# Patient Record
Sex: Female | Born: 1965 | Race: Black or African American | Hispanic: No | Smoking: Current some day smoker
Health system: Southern US, Community
[De-identification: ages and names within clinical notes are randomized; demographics above are authoritative.]

## PROBLEM LIST (undated history)

## (undated) DIAGNOSIS — C801 Malignant (primary) neoplasm, unspecified: Secondary | ICD-10-CM

## (undated) DIAGNOSIS — F1991 Other psychoactive substance use, unspecified, in remission: Secondary | ICD-10-CM

## (undated) DIAGNOSIS — Z87898 Personal history of other specified conditions: Secondary | ICD-10-CM

## (undated) DIAGNOSIS — J45909 Unspecified asthma, uncomplicated: Secondary | ICD-10-CM

## (undated) HISTORY — PX: ABDOMINAL HYSTERECTOMY: SHX81

## (undated) HISTORY — PX: OTHER SURGICAL HISTORY: SHX169

---

## 2004-11-16 ENCOUNTER — Emergency Department: Payer: Self-pay | Admitting: General Practice

## 2005-02-09 ENCOUNTER — Emergency Department: Payer: Self-pay | Admitting: Emergency Medicine

## 2006-06-19 ENCOUNTER — Emergency Department: Payer: Self-pay | Admitting: Unknown Physician Specialty

## 2010-01-19 ENCOUNTER — Observation Stay (HOSPITAL_COMMUNITY): Admission: EM | Admit: 2010-01-19 | Discharge: 2010-01-20 | Payer: Self-pay | Admitting: Emergency Medicine

## 2010-11-04 ENCOUNTER — Emergency Department (HOSPITAL_COMMUNITY)
Admission: EM | Admit: 2010-11-04 | Discharge: 2010-11-04 | Disposition: A | Discharge status: Home / Self Care | Payer: No Typology Code available for payment source | Attending: Emergency Medicine | Admitting: Emergency Medicine

## 2010-11-04 ENCOUNTER — Emergency Department (HOSPITAL_COMMUNITY): Payer: No Typology Code available for payment source

## 2010-11-04 DIAGNOSIS — J45909 Unspecified asthma, uncomplicated: Secondary | ICD-10-CM | POA: Insufficient documentation

## 2010-11-04 DIAGNOSIS — M545 Low back pain, unspecified: Secondary | ICD-10-CM | POA: Insufficient documentation

## 2012-11-13 ENCOUNTER — Emergency Department (HOSPITAL_BASED_OUTPATIENT_CLINIC_OR_DEPARTMENT_OTHER)
Admission: EM | Admit: 2012-11-13 | Discharge: 2012-11-13 | Disposition: A | Discharge status: Home / Self Care | Payer: Self-pay | Attending: Emergency Medicine | Admitting: Emergency Medicine

## 2012-11-13 ENCOUNTER — Encounter (HOSPITAL_BASED_OUTPATIENT_CLINIC_OR_DEPARTMENT_OTHER): Payer: Self-pay | Admitting: Emergency Medicine

## 2012-11-13 DIAGNOSIS — Z79899 Other long term (current) drug therapy: Secondary | ICD-10-CM | POA: Insufficient documentation

## 2012-11-13 DIAGNOSIS — Y929 Unspecified place or not applicable: Secondary | ICD-10-CM | POA: Insufficient documentation

## 2012-11-13 DIAGNOSIS — T628X1A Toxic effect of other specified noxious substances eaten as food, accidental (unintentional), initial encounter: Secondary | ICD-10-CM | POA: Insufficient documentation

## 2012-11-13 DIAGNOSIS — Z859 Personal history of malignant neoplasm, unspecified: Secondary | ICD-10-CM | POA: Insufficient documentation

## 2012-11-13 DIAGNOSIS — R221 Localized swelling, mass and lump, neck: Secondary | ICD-10-CM | POA: Insufficient documentation

## 2012-11-13 DIAGNOSIS — R220 Localized swelling, mass and lump, head: Secondary | ICD-10-CM | POA: Insufficient documentation

## 2012-11-13 DIAGNOSIS — J45909 Unspecified asthma, uncomplicated: Secondary | ICD-10-CM | POA: Insufficient documentation

## 2012-11-13 DIAGNOSIS — Z880 Allergy status to penicillin: Secondary | ICD-10-CM | POA: Insufficient documentation

## 2012-11-13 DIAGNOSIS — Y939 Activity, unspecified: Secondary | ICD-10-CM | POA: Insufficient documentation

## 2012-11-13 DIAGNOSIS — Z87891 Personal history of nicotine dependence: Secondary | ICD-10-CM | POA: Insufficient documentation

## 2012-11-13 DIAGNOSIS — T7840XA Allergy, unspecified, initial encounter: Secondary | ICD-10-CM

## 2012-11-13 HISTORY — DX: Unspecified asthma, uncomplicated: J45.909

## 2012-11-13 HISTORY — DX: Malignant (primary) neoplasm, unspecified: C80.1

## 2012-11-13 MED ORDER — DIPHENHYDRAMINE HCL 25 MG PO TABS: 25.0000 mg | ORAL_TABLET | Freq: Four times a day (QID) | ORAL | Status: DC

## 2012-11-13 MED ORDER — METHYLPREDNISOLONE SODIUM SUCC 125 MG IJ SOLR
125.0000 mg | Freq: Once | INTRAMUSCULAR | Status: AC
  Administered 2012-11-13: 125 mg via INTRAVENOUS
  Filled 2012-11-13: qty 2

## 2012-11-13 MED ORDER — FAMOTIDINE 20 MG PO TABS: 20.0000 mg | ORAL_TABLET | Freq: Two times a day (BID) | ORAL | Status: DC

## 2012-11-13 MED ORDER — PREDNISONE 50 MG PO TABS: ORAL_TABLET | ORAL | Status: DC

## 2012-11-13 MED ORDER — FAMOTIDINE IN NACL 20-0.9 MG/50ML-% IV SOLN
20.0000 mg | Freq: Once | INTRAVENOUS | Status: AC
  Administered 2012-11-13: 20 mg via INTRAVENOUS
  Filled 2012-11-13: qty 50

## 2012-11-13 MED ORDER — SODIUM CHLORIDE 0.9 % IV BOLUS (SEPSIS)
1000.0000 mL | Freq: Once | INTRAVENOUS | Status: AC
  Administered 2012-11-13: 1000 mL via INTRAVENOUS

## 2012-11-13 MED ORDER — EPINEPHRINE 0.3 MG/0.3ML IJ DEVI: 0.3000 mg | INTRAMUSCULAR | Status: DC | PRN

## 2012-11-13 NOTE — ED Notes (Signed)
MD at bedside. 

## 2012-11-13 NOTE — ED Notes (Signed)
RT Note: Patient presents to the ED following an allergic reaction. Breath sounds are clear and diminished with good air movement noted. No stridor nor acute respiratory distress was noted. Patients tongue does not appear swollen at this time either. Patient is stable and RT will continue to monitor.

## 2012-11-13 NOTE — ED Notes (Signed)
Took bite of chocolate and didn't know it had peanut butter in it.  Spit it out immediately when she tasted it, but felt the reaction.  Tongue and lips swelling and numb.  No resp affect.  No urticaria.  EMS gave Benadryl 50mg  po and 0.3 EPI IM.

## 2012-11-13 NOTE — ED Provider Notes (Signed)
History  This chart was scribed for Vanessa Octave, MD by Bennett Scrape, ED Scribe. This patient was seen in room MH02/MH02 and the patient's care was started at 4:20 PM  CSN: [id number]  Arrival date & time      First MD Initiated Contact with Patient 11/13/12 1620      Chief Complaint  Patient presents with  . Allergic Reaction     The history is provided by the patient. No language interpreter was used.    Vanessa Caldwell is a 47 y.o. female who presents to the Emergency Department complaining of gradual onset, gradually improving, constant mild facial swelling to bilateral lips with associated mild tongue swelling attributed to an allergic reaction to peanuts. Pt reports that she ate a piece of chocolate with peanut butter out of a bag of chocolates not realizing that there was peanut butter in the candy. She states that she spit out the piece of candy immediately and called EMS when she started to swell. She was given 0.3 EPI by the paramedics with improvement. She denies rash, trouble swallowing, difficulty breathing, and SOB as associated symptoms. She has a h.o asthma and reports prior hospitalizations for asthma exacerbation. She denies having a h/o HTN or being on any HTN medications.   No PCP currently, states just moved to [place].  Past Medical History  Diagnosis Date  . Asthma   . Cancer     Past Surgical History  Procedure Laterality Date  . Cesarean section    . Abdominal hysterectomy      No family history on file.  History  Substance Use Topics  . Smoking status: Former Games developer  . Smokeless tobacco: Not on file  . Alcohol Use: No    No OB history provided.  Review of Systems  A complete 10 system review of systems was obtained and all systems are negative except as noted in the HPI and PMH.   Allergies  Peanuts; Bee venom; Other; and Penicillins  Home Medications   Current Outpatient Rx  Name  Route  Sig  Dispense  Refill  . albuterol  (ACCUNEB) 1.25 MG/3ML nebulizer solution   Nebulization   Take 1 ampule by nebulization every 6 (six) hours as needed for wheezing.         Marland Kitchen albuterol (PROVENTIL HFA;VENTOLIN HFA) 108 (90 BASE) MCG/ACT inhaler   Inhalation   Inhale 2 puffs into the lungs every 6 (six) hours as needed for wheezing.           Triage Vitals: BP 156/86  Pulse 81  Temp(Src) 97.9 F (36.6 C) (Oral)  Resp 18  Ht 5\' 8"  (1.727 m)  Wt 310 lb (140.615 kg)  BMI 47.15 kg/m2  SpO2 99%  Physical Exam  Nursing note and vitals reviewed. Constitutional: She is oriented to person, place, and time. She appears well-developed and well-nourished. No distress.  HENT:  Head: Normocephalic and atraumatic.  Mild swelling of upper and lower lip, no tongue swelling, no uvular edema, floor of mouth is soft, handling secretions   Eyes: Conjunctivae and EOM are normal. Pupils are equal, round, and reactive to light.  Neck: Normal range of motion. Neck supple. No tracheal deviation present.  Cardiovascular: Normal rate and regular rhythm.   No murmur heard. Pulmonary/Chest: Effort normal and breath sounds normal. No stridor. No respiratory distress. She has no wheezes.  Abdominal: Soft. There is no tenderness.  Musculoskeletal: Normal range of motion.  Neurological: She is alert and oriented to person,  place, and time.  Skin: Skin is warm and dry. No rash noted.  Psychiatric: She has a normal mood and affect. Her behavior is normal.    ED Course  Procedures (including critical care time)  Medications  sodium chloride 0.9 % bolus 1,000 mL (1,000 mLs Intravenous New Bag/Given 11/13/12 1654)  methylPREDNISolone sodium succinate (SOLU-MEDROL) 125 mg/2 mL injection 125 mg (125 mg Intravenous Given 11/13/12 1655)  famotidine (PEPCID) IVPB 20 mg (0 mg Intravenous Stopped 11/13/12 1730)    DIAGNOSTIC STUDIES: Oxygen Saturation is 99% on room air, normal by my interpretation.    COORDINATION OF CARE: 4:35 PM-Discussed  treatment plan which includes prednisone and observation with pt at bedside and pt agreed to plan.   5:51 PM-Pt rechecked and is resting comfortably. Discussed discharge plan with pt and pt agreed to plan. Also advised pt to follow up as needed and pt agreed.  Labs Reviewed - No data to display No results found.   No diagnosis found.    MDM  Patient presents with tongue and lip swelling after allergic reaction to peanuts. She received epinephrine and antihistamines by EMS. No chest pain, shortness of breath or wheezing.  Patient states she is feeling better in terms of her tongue and lip swelling. Denies any difficulty breathing or wheezing. she is observed in the ED for one hour without deterioration in symptoms. Her tongue and swelling improved. Will discharge on antihistamines, steroids, EpiPen given, return precautions discussed.     I personally performed the services described in this documentation, which was scribed in my presence. The recorded information has been reviewed and is accurate.      Vanessa Octave, MD 11/13/12 1758

## 2014-01-25 ENCOUNTER — Emergency Department: Payer: Self-pay | Admitting: Emergency Medicine

## 2014-02-05 ENCOUNTER — Emergency Department: Payer: Self-pay | Admitting: Emergency Medicine

## 2014-02-05 LAB — COMPREHENSIVE METABOLIC PANEL
ALT: 18 U/L (ref 12–78)
Albumin: 3.7 g/dL (ref 3.4–5.0)
Alkaline Phosphatase: 91 U/L
Anion Gap: 8 (ref 7–16)
BUN: 17 mg/dL (ref 7–18)
Bilirubin,Total: 0.4 mg/dL (ref 0.2–1.0)
CO2: 26 mmol/L (ref 21–32)
Calcium, Total: 8.9 mg/dL (ref 8.5–10.1)
Chloride: 103 mmol/L (ref 98–107)
Creatinine: 1.26 mg/dL (ref 0.60–1.30)
EGFR (African American): 58 — ABNORMAL LOW
GFR CALC NON AF AMER: 50 — AB
Glucose: 153 mg/dL — ABNORMAL HIGH (ref 65–99)
OSMOLALITY: 278 (ref 275–301)
POTASSIUM: 3.7 mmol/L (ref 3.5–5.1)
SGOT(AST): 15 U/L (ref 15–37)
SODIUM: 137 mmol/L (ref 136–145)
Total Protein: 8.2 g/dL (ref 6.4–8.2)

## 2014-02-05 LAB — CBC
HCT: 39.8 % (ref 35.0–47.0)
HGB: 13.3 g/dL (ref 12.0–16.0)
MCH: 31.6 pg (ref 26.0–34.0)
MCHC: 33.4 g/dL (ref 32.0–36.0)
MCV: 95 fL (ref 80–100)
Platelet: 249 10*3/uL (ref 150–440)
RBC: 4.21 10*6/uL (ref 3.80–5.20)
RDW: 12.7 % (ref 11.5–14.5)
WBC: 10.3 10*3/uL (ref 3.6–11.0)

## 2014-02-14 ENCOUNTER — Emergency Department: Payer: Self-pay | Admitting: Emergency Medicine

## 2014-02-15 LAB — BASIC METABOLIC PANEL
ANION GAP: 9 (ref 7–16)
BUN: 15 mg/dL (ref 7–18)
CO2: 28 mmol/L (ref 21–32)
CREATININE: 1.29 mg/dL (ref 0.60–1.30)
Calcium, Total: 9.3 mg/dL (ref 8.5–10.1)
Chloride: 105 mmol/L (ref 98–107)
EGFR (African American): 56 — ABNORMAL LOW
EGFR (Non-African Amer.): 49 — ABNORMAL LOW
GLUCOSE: 126 mg/dL — AB (ref 65–99)
OSMOLALITY: 285 (ref 275–301)
POTASSIUM: 4.4 mmol/L (ref 3.5–5.1)
SODIUM: 142 mmol/L (ref 136–145)

## 2014-02-15 LAB — CBC WITH DIFFERENTIAL/PLATELET
BASOS ABS: 0 10*3/uL (ref 0.0–0.1)
Basophil %: 0.5 %
EOS ABS: 0.2 10*3/uL (ref 0.0–0.7)
Eosinophil %: 2.6 %
HCT: 36.9 % (ref 35.0–47.0)
HGB: 12.4 g/dL (ref 12.0–16.0)
LYMPHS ABS: 1.5 10*3/uL (ref 1.0–3.6)
Lymphocyte %: 22.1 %
MCH: 31.6 pg (ref 26.0–34.0)
MCHC: 33.7 g/dL (ref 32.0–36.0)
MCV: 94 fL (ref 80–100)
MONOS PCT: 7.2 %
Monocyte #: 0.5 x10 3/mm (ref 0.2–0.9)
NEUTROS ABS: 4.7 10*3/uL (ref 1.4–6.5)
NEUTROS PCT: 67.6 %
Platelet: 212 10*3/uL (ref 150–440)
RBC: 3.93 10*6/uL (ref 3.80–5.20)
RDW: 12.7 % (ref 11.5–14.5)
WBC: 7 10*3/uL (ref 3.6–11.0)

## 2014-06-12 ENCOUNTER — Observation Stay: Payer: Self-pay | Admitting: Internal Medicine

## 2014-06-12 LAB — CBC WITH DIFFERENTIAL/PLATELET
BASOS PCT: 0.7 %
Basophil #: 0.1 10*3/uL (ref 0.0–0.1)
EOS ABS: 0.2 10*3/uL (ref 0.0–0.7)
EOS PCT: 2.5 %
HCT: 38.5 % (ref 35.0–47.0)
HGB: 12.6 g/dL (ref 12.0–16.0)
LYMPHS ABS: 1.5 10*3/uL (ref 1.0–3.6)
LYMPHS PCT: 20 %
MCH: 30.8 pg (ref 26.0–34.0)
MCHC: 32.7 g/dL (ref 32.0–36.0)
MCV: 94 fL (ref 80–100)
Monocyte #: 0.5 x10 3/mm (ref 0.2–0.9)
Monocyte %: 7.1 %
NEUTROS ABS: 5.1 10*3/uL (ref 1.4–6.5)
Neutrophil %: 69.7 %
PLATELETS: 226 10*3/uL (ref 150–440)
RBC: 4.09 10*6/uL (ref 3.80–5.20)
RDW: 12.9 % (ref 11.5–14.5)
WBC: 7.3 10*3/uL (ref 3.6–11.0)

## 2014-06-12 LAB — BASIC METABOLIC PANEL
Anion Gap: 7 (ref 7–16)
BUN: 13 mg/dL (ref 7–18)
CALCIUM: 8.5 mg/dL (ref 8.5–10.1)
CO2: 27 mmol/L (ref 21–32)
Chloride: 104 mmol/L (ref 98–107)
Creatinine: 0.92 mg/dL (ref 0.60–1.30)
EGFR (African American): >60
GLUCOSE: 112 mg/dL — AB (ref 65–99)
OSMOLALITY: 277 (ref 275–301)
POTASSIUM: 4 mmol/L (ref 3.5–5.1)
Sodium: 138 mmol/L (ref 136–145)

## 2014-10-08 ENCOUNTER — Ambulatory Visit: Payer: Self-pay

## 2014-11-15 NOTE — Discharge Summary (Signed)
PATIENT NAME:  Vanessa Caldwell, Vanessa Caldwell MR#:  [id number] DATE OF BIRTH:  03/22/1966  DATE OF ADMISSION:  06/12/2014 DATE OF DISCHARGE:  06/13/2014  PRESENTING COMPLAINT: Right leg swelling and bug bite.  DISCHARGE DIAGNOSES:  1.  Right tibial shin cellulitis status post bug bite, improved.  2.  Asthma.  3.  Morbid obesity.  CONDITION ON DISCHARGE: Fair.  CODE STATUS: FULL code.   DISCHARGE MEDICATIONS: 1.  Clindamycin 600 mg p.o. t.i.d. 2.  Ibuprofen 600 mg t.i.d. with meals as needed.  3.  ProAir HFA 90 mcg inhalation 2 puffs 4 times a day as needed.   DISCHARGE INSTRUCTIONS: Follow up with your primary care physician as needed. Use rolling walker.   DIAGNOSTIC DATA: Hemoglobin 12.6 and 38.5. White count 7.3. Basic metabolic panel within normal limits.   BRIEF SUMMARY OF HOSPITAL COURSE: Ms. Sweaney is a 48 year old Caucasian female with past medical history of asthma and obesity who comes in with:  1.  Cellulitis, right lower extremity, after bug bite. No fever. She was on IV vancomycin. Ibuprofen for pain. Good pedal pulses. Changed to p.o. clindamycin. Blood cultures were not sent from the ER. The patient remained afebrile.  2.  Asthma. Completely stable.  3.  Morbid obesity. The patient encouraged balanced diet and exercise.   Hospital stay otherwise remained stable. The patient remained a FULL code.   TIME SPENT: 40 minutes.  ____________________________ Hart Rochester Posey Pronto, MD sap:sb D: 06/13/2014 14:45:05 ET T: 06/13/2014 17:12:47 ET JOB#: [id number]  cc: Humzah Harty A. Posey Pronto, MD, <Dictator> Ilda Basset MD ELECTRONICALLY SIGNED 06/26/2014 14:06

## 2014-11-15 NOTE — H&P (Signed)
PATIENT NAME:  Vanessa Caldwell, Vanessa Caldwell MR#:  [id number] DATE OF BIRTH:  1966/07/16  DATE OF ADMISSION:  06/12/2014  REFERRING PHYSICIAN: Larae Grooms, MD  PRIMARY CARE PHYSICIAN: Nonlocal.   ADMISSION DIAGNOSIS: Cellulitis.   HISTORY OF PRESENT ILLNESS: This is a 49 year old African American female who presents to the Emergency Department complaining of pain in her right lower extremity. The patient states that 4 days ago she felt something nonpainful on her right lower leg that she thought might be lint. On closer inspection, the object was found to be an insect that was trying to burrow into her leg. The patient states that her fiancee was able to remove the insect with some tweezers. She covered the wound area with an oatmeal preparation hoping that it would help with the pain and redness. However, both the pain and redness increased over the next few days. She denies any fevers, but admits to some nausea. She smoked some marijuana that helped relieve her nausea. She came to the Emergency Department because her leg is so tender and swollen that she states it is hard to walk. The patient is confident that the bug was not a tick. She describes it as flat and red and that it was very difficult to kill. Due to a concern for rapidly progressive cellulitis and possible necrotizing fasciitis, the Emergency Department called for admission.   REVIEW OF SYSTEMS: CONSTITUTIONAL: The patient denies fever or weakness.  EYES: Denies blurred vision or inflammation.  EARS, NOSE AND THROAT: Denies tinnitus or difficulty swallowing.  RESPIRATORY: Denies cough or wheezing.  CARDIOVASCULAR: Denies chest pain, orthopnea, palpitations.  GASTROINTESTINAL: Admits to nausea but denies vomiting, diarrhea or abdominal pain.  GENITOURINARY: Denies dysuria or increased frequency or hesitancy.  ENDOCRINE: Denies polyuria, polydipsia. HEMATOLOGIC AND LYMPHATIC: Denies easy bruising or bleeding.  INTEGUMENT: Admits to an insect  bite and surrounding pain and erythema. MUSCULOSKELETAL: Admits to myalgias in the right lower extremity when she tries to ambulate, but denies arthralgias. NEUROLOGIC: Denies numbness in her extremities or difficulty speaking.  PSYCHIATRIC: Denies depression or suicidal ideation.   PAST MEDICAL HISTORY: Asthma. The patient has had cellulitis in the past from wounds associated with bee stings and spider bites.  PAST SURGICAL HISTORY: C-sections, partial hysterectomy, tonsillectomy and adenoidectomy.   FAMILY HISTORY: All of her immediate family has diabetes. Heart disease runs throughout the family and her father is suffering from prostate cancer.   SOCIAL HISTORY: The patient states that she rarely drinks or smokes. 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 Sometimes she smokes marijuana.  MEDICATIONS: ProAir 90 mcg/inhalation 2 puffs inhaled every 4 hours as needed for coughing, wheezing, or shortness of breath.   ALLERGIES: BEE STINGS AND PENICILLIN.  DIAGNOSTIC DATA: Pertinent laboratory results and radiographic findings are not available at this time as they are pending.   PHYSICAL EXAMINATION: VITAL SIGNS: Temperature is 97.8, pulse 80, respirations 20, blood pressure 140/85, pulse oximetry is 97% on room air.  GENERAL: The patient is alert and oriented x3, in no apparent distress.  HEENT: Normocephalic, atraumatic. Pupils equal, round, and reactive to light and accommodation. Extraocular movements are intact. Mucous membranes are moist.  NECK: Trachea is midline. No adenopathy.  CHEST: Symmetric and atraumatic.  CARDIOVASCULAR: Regular rate and rhythm. Normal S1, S2. No rubs, clicks, or murmurs appreciated.  LUNGS: Clear to auscultation bilaterally. Normal effort and excursion.  ABDOMEN: Positive bowel sounds. Soft, nontender, nondistended. No hepatosplenomegaly. GENITALIA: Deferred.  MUSCULOSKELETAL: The patient moves all 4 extremities  equally. There is 5/5 strength in  upper and lower extremities bilaterally.  SKIN: The patient has area of erythema, tenderness and warmth that is non-suppurative and  non-fluctuant that I have outlined on her right lower extremity. At its greatest dimension, it is approximately 8 inches in length. There is swelling even around the non-erythematous area and the patient states that it is painful to palpate in the non-erythematous areas as well.  NEUROLOGIC: Cranial nerves II through XII are grossly intact.  PSYCHIATRIC: Mood is normal. Affect is congruent.   ASSESSMENT AND PLAN: This is a 49 year old female admitted for cellulitis.  1.  Cellulitis, extremely painful, and one might say it is out of proportion with the physical examination. The concern is for necrotizing fasciitis. The patient has received a dose of vancomycin and Zosyn in the Emergency Department. I will order a surgery consult to evaluate the need for debridement.  2.  Asthma, completely stable. The patient denies any respiratory symptoms associated with this illness.  3.  Morbid obesity. The patient's BMI is 43.9. I have encouraged a balanced diet and exercise.  4.  Substance abuse. The patient has not had any recreational narcotics in 19 years. She admits to occasional marijuana use, however.  5.  Deep vein thrombosis prophylaxis. Heparin.  6.  Gastrointestinal prophylaxis. Unnecessary as the patient is not critically ill.   CODE STATUS: The patient is a FULL code.   TIME SPENT ON ADMISSION ORDERS AND PATIENT CARE: Approximately 35 minutes.   ____________________________ Norva Riffle. Marcille Blanco, MD msd:sb D: 06/12/2014 07:26:35 ET T: 06/12/2014 07:49:47 ET JOB#: [id number]  cc: Norva Riffle. Marcille Blanco, MD, <Dictator>  Norva Riffle Kerston Landeck MD ELECTRONICALLY SIGNED 06/14/2014 0:57

## 2014-11-18 ENCOUNTER — Emergency Department
Admit: 2014-11-18 | Disposition: A | Discharge status: Home / Self Care | Payer: Self-pay | Admitting: Emergency Medicine

## 2014-11-18 LAB — CBC WITH DIFFERENTIAL/PLATELET
BASOS ABS: 0.1 10*3/uL (ref 0.0–0.1)
BASOS PCT: 0.9 %
EOS ABS: 0.2 10*3/uL (ref 0.0–0.7)
EOS PCT: 1.7 %
HCT: 41.1 % (ref 35.0–47.0)
HGB: 14.1 g/dL (ref 12.0–16.0)
Lymphocyte #: 2 10*3/uL (ref 1.0–3.6)
Lymphocyte %: 22.5 %
MCH: 32.2 pg (ref 26.0–34.0)
MCHC: 34.2 g/dL (ref 32.0–36.0)
MCV: 94 fL (ref 80–100)
MONO ABS: 0.4 x10 3/mm (ref 0.2–0.9)
Monocyte %: 4.8 %
NEUTROS PCT: 70.1 %
Neutrophil #: 6.3 10*3/uL (ref 1.4–6.5)
Platelet: 230 10*3/uL (ref 150–440)
RBC: 4.37 10*6/uL (ref 3.80–5.20)
RDW: 12.7 % (ref 11.5–14.5)
WBC: 8.9 10*3/uL (ref 3.6–11.0)

## 2014-11-18 LAB — URINALYSIS, COMPLETE
BILIRUBIN, UR: NEGATIVE
Glucose,UR: NEGATIVE mg/dL (ref 0–75)
Ketone: NEGATIVE
Nitrite: NEGATIVE
PH: 5 (ref 4.5–8.0)
Protein: NEGATIVE
SPECIFIC GRAVITY: 1.024 (ref 1.003–1.030)

## 2014-11-18 LAB — WET PREP, GENITAL

## 2014-11-18 LAB — COMPREHENSIVE METABOLIC PANEL
ALBUMIN: 4.4 g/dL
ALT: 14 U/L
AST: 22 U/L
Alkaline Phosphatase: 88 U/L
Anion Gap: 8 (ref 7–16)
BUN: 13 mg/dL
Bilirubin,Total: 0.4 mg/dL
CALCIUM: 9.4 mg/dL
Chloride: 104 mmol/L
Co2: 26 mmol/L
Creatinine: 0.96 mg/dL
EGFR (Non-African Amer.): >60
GLUCOSE: 112 mg/dL — AB
POTASSIUM: 4.4 mmol/L
SODIUM: 138 mmol/L
TOTAL PROTEIN: 8.6 g/dL — AB

## 2014-11-19 LAB — GC/CHLAMYDIA PROBE AMP

## 2014-12-06 DIAGNOSIS — R1900 Intra-abdominal and pelvic swelling, mass and lump, unspecified site: Secondary | ICD-10-CM | POA: Insufficient documentation

## 2014-12-18 ENCOUNTER — Encounter: Payer: Self-pay | Admitting: *Deleted

## 2014-12-18 ENCOUNTER — Inpatient Hospital Stay
Admission: AD | Admit: 2014-12-18 | Discharge: 2014-12-25 | DRG: 742 | Disposition: A | Discharge status: Home / Self Care | Payer: Self-pay | Source: Ambulatory Visit | Attending: Obstetrics and Gynecology | Admitting: Obstetrics and Gynecology

## 2014-12-18 DIAGNOSIS — Z9889 Other specified postprocedural states: Secondary | ICD-10-CM

## 2014-12-18 DIAGNOSIS — R52 Pain, unspecified: Secondary | ICD-10-CM | POA: Diagnosis present

## 2014-12-18 DIAGNOSIS — F1290 Cannabis use, unspecified, uncomplicated: Secondary | ICD-10-CM | POA: Diagnosis present

## 2014-12-18 DIAGNOSIS — R310 Gross hematuria: Secondary | ICD-10-CM | POA: Diagnosis present

## 2014-12-18 DIAGNOSIS — N839 Noninflammatory disorder of ovary, fallopian tube and broad ligament, unspecified: Principal | ICD-10-CM | POA: Diagnosis present

## 2014-12-18 DIAGNOSIS — N736 Female pelvic peritoneal adhesions (postinfective): Secondary | ICD-10-CM | POA: Diagnosis present

## 2014-12-18 DIAGNOSIS — Z880 Allergy status to penicillin: Secondary | ICD-10-CM

## 2014-12-18 DIAGNOSIS — K913 Postprocedural intestinal obstruction: Secondary | ICD-10-CM | POA: Diagnosis present

## 2014-12-18 DIAGNOSIS — D62 Acute posthemorrhagic anemia: Secondary | ICD-10-CM | POA: Diagnosis not present

## 2014-12-18 DIAGNOSIS — R34 Anuria and oliguria: Secondary | ICD-10-CM | POA: Diagnosis present

## 2014-12-18 DIAGNOSIS — J45909 Unspecified asthma, uncomplicated: Secondary | ICD-10-CM | POA: Diagnosis present

## 2014-12-18 DIAGNOSIS — Z87891 Personal history of nicotine dependence: Secondary | ICD-10-CM

## 2014-12-18 DIAGNOSIS — Z6841 Body Mass Index (BMI) 40.0 and over, adult: Secondary | ICD-10-CM

## 2014-12-18 DIAGNOSIS — R1900 Intra-abdominal and pelvic swelling, mass and lump, unspecified site: Secondary | ICD-10-CM | POA: Diagnosis present

## 2014-12-18 HISTORY — DX: Personal history of other specified conditions: Z87.898

## 2014-12-18 HISTORY — DX: Other psychoactive substance use, unspecified, in remission: F19.91

## 2014-12-18 LAB — CBC WITH DIFFERENTIAL/PLATELET
BASOS PCT: 1 %
Basophils Absolute: 0 10*3/uL (ref 0–0.1)
EOS ABS: 0.2 10*3/uL (ref 0–0.7)
Eosinophils Relative: 3 %
HCT: 36.9 % (ref 35.0–47.0)
Hemoglobin: 12.5 g/dL (ref 12.0–16.0)
Lymphocytes Relative: 31 %
Lymphs Abs: 2.2 10*3/uL (ref 1.0–3.6)
MCH: 32.1 pg (ref 26.0–34.0)
MCHC: 33.8 g/dL (ref 32.0–36.0)
MCV: 94.9 fL (ref 80.0–100.0)
MONO ABS: 0.4 10*3/uL (ref 0.2–0.9)
MONOS PCT: 5 %
NEUTROS ABS: 4.3 10*3/uL (ref 1.4–6.5)
Neutrophils Relative %: 60 %
PLATELETS: 193 10*3/uL (ref 150–440)
RBC: 3.88 MIL/uL (ref 3.80–5.20)
RDW: 12.8 % (ref 11.5–14.5)
WBC: 7.1 10*3/uL (ref 3.6–11.0)

## 2014-12-18 LAB — COMPREHENSIVE METABOLIC PANEL
ALT: 12 U/L — AB (ref 14–54)
AST: 24 U/L (ref 15–41)
Albumin: 4 g/dL (ref 3.5–5.0)
Alkaline Phosphatase: 79 U/L (ref 38–126)
Anion gap: 7 (ref 5–15)
BUN: 15 mg/dL (ref 6–20)
CO2: 26 mmol/L (ref 22–32)
Calcium: 8.9 mg/dL (ref 8.9–10.3)
Chloride: 105 mmol/L (ref 101–111)
Creatinine, Ser: 0.9 mg/dL (ref 0.44–1.00)
GFR calc Af Amer: >60 mL/min (ref >60)
Glucose, Bld: 148 mg/dL — ABNORMAL HIGH (ref 65–99)
Potassium: 3.4 mmol/L — ABNORMAL LOW (ref 3.5–5.1)
Sodium: 138 mmol/L (ref 135–145)
TOTAL PROTEIN: 7.4 g/dL (ref 6.5–8.1)
Total Bilirubin: 0.4 mg/dL (ref 0.3–1.2)

## 2014-12-18 LAB — URINALYSIS COMPLETE WITH MICROSCOPIC (ARMC ONLY)
Bilirubin Urine: NEGATIVE
Glucose, UA: NEGATIVE mg/dL
Hgb urine dipstick: NEGATIVE
Ketones, ur: NEGATIVE mg/dL
NITRITE: NEGATIVE
PROTEIN: NEGATIVE mg/dL
Specific Gravity, Urine: 1.019 (ref 1.005–1.030)
pH: 5 (ref 5.0–8.0)

## 2014-12-18 LAB — LACTATE DEHYDROGENASE: LDH: 126 U/L (ref 98–192)

## 2014-12-18 LAB — PROTIME-INR
INR: 0.98
PROTHROMBIN TIME: 13.2 s (ref 11.4–15.0)

## 2014-12-18 LAB — LIPASE, BLOOD: Lipase: 35 U/L (ref 22–51)

## 2014-12-18 MED ORDER — FAMOTIDINE 20 MG PO TABS
20.0000 mg | ORAL_TABLET | Freq: Two times a day (BID) | ORAL | Status: DC
Start: 2014-12-18 — End: 2014-12-19
  Administered 2014-12-18 – 2014-12-19 (×2): 20 mg via ORAL
  Filled 2014-12-18 (×2): qty 1

## 2014-12-18 MED ORDER — DIPHENHYDRAMINE HCL 50 MG/ML IJ SOLN: 12.5000 mg | Freq: Four times a day (QID) | INTRAMUSCULAR | Status: DC | PRN

## 2014-12-18 MED ORDER — ONDANSETRON HCL 4 MG/2ML IJ SOLN
4.0000 mg | Freq: Four times a day (QID) | INTRAMUSCULAR | Status: DC | PRN
  Administered 2014-12-24: 4 mg via INTRAVENOUS
  Filled 2014-12-18 (×4): qty 2

## 2014-12-18 MED ORDER — SODIUM CHLORIDE 0.9 % IJ SOLN: 9.0000 mL | INTRAMUSCULAR | Status: DC | PRN

## 2014-12-18 MED ORDER — BUDESONIDE 0.25 MG/2ML IN SUSP
0.2500 mg | Freq: Two times a day (BID) | RESPIRATORY_TRACT | Status: DC
  Administered 2014-12-18 – 2014-12-20 (×4): 0.25 mg via RESPIRATORY_TRACT
  Filled 2014-12-18 (×7): qty 2

## 2014-12-18 MED ORDER — HYDROMORPHONE 0.3 MG/ML IV SOLN
INTRAVENOUS | Status: DC
  Administered 2014-12-19 (×2): via INTRAVENOUS
  Administered 2014-12-19: 7.1 mL via INTRAVENOUS
  Filled 2014-12-18 (×2): qty 25

## 2014-12-18 MED ORDER — HYDROMORPHONE HCL 1 MG/ML IJ SOLN
0.2000 mg | INTRAMUSCULAR | Status: DC | PRN
  Administered 2014-12-18: 0.6 mg via INTRAVENOUS
  Filled 2014-12-18: qty 1

## 2014-12-18 MED ORDER — DIPHENHYDRAMINE HCL 12.5 MG/5ML PO ELIX
12.5000 mg | ORAL_SOLUTION | Freq: Four times a day (QID) | ORAL | Status: DC | PRN
  Filled 2014-12-18: qty 5

## 2014-12-18 MED ORDER — LACTATED RINGERS IV SOLN
INTRAVENOUS | Status: DC
  Administered 2014-12-18 – 2014-12-19 (×4): via INTRAVENOUS

## 2014-12-18 MED ORDER — DIPHENHYDRAMINE HCL 25 MG PO TABS
25.0000 mg | ORAL_TABLET | Freq: Four times a day (QID) | ORAL | Status: DC
  Administered 2014-12-18: 25 mg via ORAL
  Filled 2014-12-18 (×12): qty 1

## 2014-12-18 MED ORDER — ONDANSETRON HCL 4 MG PO TABS: 4.0000 mg | ORAL_TABLET | Freq: Four times a day (QID) | ORAL | Status: DC | PRN

## 2014-12-18 MED ORDER — ALBUTEROL SULFATE (2.5 MG/3ML) 0.083% IN NEBU
3.0000 mL | INHALATION_SOLUTION | Freq: Four times a day (QID) | RESPIRATORY_TRACT | Status: DC | PRN
  Administered 2014-12-19 – 2014-12-21 (×3): 3 mL via RESPIRATORY_TRACT
  Filled 2014-12-18 (×3): qty 3

## 2014-12-18 MED ORDER — NALOXONE HCL 0.4 MG/ML IJ SOLN: 0.4000 mg | INTRAMUSCULAR | Status: DC | PRN

## 2014-12-18 MED ORDER — IOHEXOL 240 MG/ML SOLN
50.0000 mL | Freq: Once | INTRAMUSCULAR | Status: AC | PRN
  Administered 2014-12-18: 50 mL via ORAL

## 2014-12-18 MED ORDER — ONDANSETRON HCL 4 MG/2ML IJ SOLN: 4.0000 mg | Freq: Four times a day (QID) | INTRAMUSCULAR | Status: DC | PRN

## 2014-12-18 MED ORDER — OXYCODONE-ACETAMINOPHEN 5-325 MG PO TABS
1.0000 | ORAL_TABLET | ORAL | Status: DC | PRN
  Administered 2014-12-18: 2 via ORAL
  Filled 2014-12-18: qty 2

## 2014-12-18 MED ORDER — IBUPROFEN 800 MG PO TABS
800.0000 mg | ORAL_TABLET | Freq: Three times a day (TID) | ORAL | Status: DC | PRN
  Administered 2014-12-18 – 2014-12-25 (×3): 800 mg via ORAL
  Filled 2014-12-18 (×3): qty 1

## 2014-12-18 NOTE — H&P (Signed)
Chief Complaint:    Vanessa Caldwell is a 49 y.o. female here for pelvic mass and pelvic pain. Marland Kitchen Pt presented to clinic today and was admitted to the hospital after my visit with her.  Pt seen by me after ER visit 2 weeks ago with a right midline pelvic mass (see description below) measuring 11x10x10cm noted on ultrasound. Her CA125 was normal at 8. She returns today for a preop visit, at which time I had planned on consulting with gyn onc for surgical planning using minimally invasive techniques if possible.  However, she appears much worse. She is c/o bilateral CVA pain and burning, anuria, gross hematuria and pelvic pain and pressure. She is tearful. She states that she voids in the morning and has trouble releasing any urine through the rest of the day. She also has some nausea. She has pelvic pressure and midline pelvic pain.   Review of the MRI today read bilateral masses on ovaries with several septations, no ascites, no pelvic lymphadenopathy, no enhancement. Previously, the ultrasound found with no ascites, no excresenses, solid and homogenous appearing large 11cm mass.  Pt is present with fiance of many years, who is her primary support.   Past Medical History:  has a past medical history of Anxiety; Asthma without status asthmaticus; and Encounter for blood transfusion.  Past Surgical History:  has past surgical history that includes Cesarean section and Hysterectomy (02/19/1987). She had a C-hyst with the delivery of her last child for possible fibroids, fetal distress and fetal entanglement requiring them to "chop up" her uterus to get the baby out.  Family History: family history includes Diabetes mellitus in her father, maternal grandmother, mother, paternal grandfather, and paternal grandmother; Heart attack in her maternal grandfather; Heart disease in her father; Pancreatic cancer in her father. Social History:  reports that she has quit smoking. She has never used smokeless tobacco.  She reports that she drinks alcohol. She reports that she uses illicit drugs. - THC. Remote hx of crack cocaine use >31yrs ago.  OB/GYN History:  OB History    Gravida Para Term Preterm AB TAB SAB Ectopic Multiple Living   3 3 1 2      3       Allergies: is allergic to penicillin. unknown reaction and latex Medications:  Current outpatient prescriptions:  .  albuterol 90 mcg/actuation inhaler, Inhale 2 inhalations into the lungs every 6 (six) hours as needed for Wheezing., Disp: , Rfl:   Review of Systems: See HPI    Exam:   Filed Vitals:   12/18/14 1611  BP: 144/91  Pulse: 72    Body mass index is 46.82 kg/(m^2).  General: Well-developed, well-nourished black female in no moderate distress Body mass index is 46.82 kg/(m^2). Skin: No rashes, ulcers or skin lesions noted. No excessive hirsutism or acne noted. +varicose veins in bilateral lower ext Neurological: Appears alert and oriented and is a good historian. No gross abnormalities are noted. Psychological: Normal affect and mood. No signs of anxiety or depression noted.  Abdomen: Soft, diffusely tender in midline lower pelvis, without palpable hepatosplenomegaly or masses. No hernias. +significant CVA bilateral tenderness Pelvic exam: Deferred. At last visit, limited by habitus.    Impression:   The primary encounter diagnosis was Pelvic mass. Diagnoses of Severe pain and Morbid obesity with BMI of 45.0-49.9, adult were also pertinent to this visit. Hematuria.     Plan:   - With the sx of bilateral back pain at kidneys and urinary dysfunction with  blood, I am concerned that the mass is causing ureteral kinking with resulting kidney damage. There is also the possibility of cancer invasion into the urinary tract. At this time, we do not have any of the criteria to refer to gyn onc (Elevated CA125, ascites, evidence of distant mets, or fixed and nodular pelvic mass.) I will talk to them tomorrow however to see if they  think we should plan to have them available and get a frozen specimen at surgery. If any evidence of kidney compromise, will require urologic intervention promptly whether cancer is present or not.  - Direct Admission now for stat CT of kidneys and pelvis with oral and iv contrast if GFR >30, pain control, stat BMP and CBC, cancer screening labs (CEA, inhibin, AFP, LDH) , iv hydration, urology consult.  - SCDs for DVT ppx. - NPO currently - Ambulate as tolerated.   Sherrie George, MD

## 2014-12-19 ENCOUNTER — Encounter: Payer: Self-pay | Admitting: Radiology

## 2014-12-19 ENCOUNTER — Observation Stay: Payer: Self-pay

## 2014-12-19 ENCOUNTER — Observation Stay: Payer: No Typology Code available for payment source | Admitting: Anesthesiology

## 2014-12-19 ENCOUNTER — Observation Stay: Payer: Self-pay | Admitting: Anesthesiology

## 2014-12-19 ENCOUNTER — Encounter
Admission: AD | Disposition: A | Discharge status: Home / Self Care | Payer: Self-pay | Source: Ambulatory Visit | Attending: Obstetrics and Gynecology

## 2014-12-19 DIAGNOSIS — Z9889 Other specified postprocedural states: Secondary | ICD-10-CM

## 2014-12-19 HISTORY — PX: CYSTOSCOPY W/ URETERAL STENT REMOVAL: SHX1430

## 2014-12-19 HISTORY — PX: OOPHORECTOMY: SHX6387

## 2014-12-19 HISTORY — PX: LAPAROTOMY WITH STAGING: SHX5938

## 2014-12-19 HISTORY — PX: EXAMINATION UNDER ANESTHESIA: SHX1540

## 2014-12-19 HISTORY — PX: OMENTECTOMY: SHX5985

## 2014-12-19 HISTORY — PX: CYSTOSCOPY: SHX5120

## 2014-12-19 LAB — PREPARE RBC (CROSSMATCH)

## 2014-12-19 LAB — URINE DRUG SCREEN, QUALITATIVE (ARMC ONLY)
Amphetamines, Ur Screen: NOT DETECTED
BENZODIAZEPINE, UR SCRN: NOT DETECTED
Barbiturates, Ur Screen: NOT DETECTED
CANNABINOID 50 NG, UR ~~LOC~~: POSITIVE — AB
COCAINE METABOLITE, UR ~~LOC~~: NOT DETECTED
MDMA (Ecstasy)Ur Screen: NOT DETECTED
Methadone Scn, Ur: NOT DETECTED
OPIATE, UR SCREEN: POSITIVE — AB
PHENCYCLIDINE (PCP) UR S: NOT DETECTED
Tricyclic, Ur Screen: POSITIVE — AB

## 2014-12-19 SURGERY — LAPAROTOMY, EXPLORATORY, FOR STAGING
Anesthesia: General

## 2014-12-19 MED ORDER — HYDROMORPHONE HCL 1 MG/ML IJ SOLN
INTRAMUSCULAR | Status: AC
  Administered 2014-12-19: 0.5 mg via INTRAVENOUS
  Filled 2014-12-19: qty 1

## 2014-12-19 MED ORDER — ONDANSETRON HCL 4 MG/2ML IJ SOLN
INTRAMUSCULAR | Status: DC | PRN
  Administered 2014-12-19: 4 mg via INTRAVENOUS

## 2014-12-19 MED ORDER — MENTHOL 3 MG MT LOZG
1.0000 | LOZENGE | OROMUCOSAL | Status: DC | PRN
  Filled 2014-12-19: qty 9

## 2014-12-19 MED ORDER — DIPHENHYDRAMINE HCL 50 MG/ML IJ SOLN: 12.5000 mg | Freq: Four times a day (QID) | INTRAMUSCULAR | Status: DC | PRN

## 2014-12-19 MED ORDER — DEXAMETHASONE SODIUM PHOSPHATE 4 MG/ML IJ SOLN
INTRAMUSCULAR | Status: DC | PRN
  Administered 2014-12-19: 10 mg via INTRAVENOUS

## 2014-12-19 MED ORDER — NALOXONE HCL 0.4 MG/ML IJ SOLN: 0.4000 mg | INTRAMUSCULAR | Status: DC | PRN

## 2014-12-19 MED ORDER — ACETAMINOPHEN 10 MG/ML IV SOLN
INTRAVENOUS | Status: DC | PRN
  Administered 2014-12-19: 1000 mg via INTRAVENOUS

## 2014-12-19 MED ORDER — LACTATED RINGERS IV SOLN
INTRAVENOUS | Status: DC
  Administered 2014-12-19 – 2014-12-25 (×15): via INTRAVENOUS

## 2014-12-19 MED ORDER — VANCOMYCIN HCL 1000 MG IV SOLR
INTRAVENOUS | Status: AC
  Filled 2014-12-19: qty 1000

## 2014-12-19 MED ORDER — STERILE WATER FOR IRRIGATION IR SOLN
Status: DC | PRN
  Administered 2014-12-19: 2000 mL

## 2014-12-19 MED ORDER — FENTANYL CITRATE (PF) 100 MCG/2ML IJ SOLN
25.0000 ug | INTRAMUSCULAR | Status: DC | PRN
  Administered 2014-12-19 (×4): 25 ug via INTRAVENOUS

## 2014-12-19 MED ORDER — NEOSTIGMINE METHYLSULFATE 10 MG/10ML IV SOLN
INTRAVENOUS | Status: DC | PRN
  Administered 2014-12-19: 5 mg via INTRAVENOUS

## 2014-12-19 MED ORDER — IOHEXOL 350 MG/ML SOLN
100.0000 mL | Freq: Once | INTRAVENOUS | Status: AC | PRN
  Administered 2014-12-19: 100 mL via INTRAVENOUS

## 2014-12-19 MED ORDER — ONDANSETRON HCL 4 MG/2ML IJ SOLN: 4.0000 mg | Freq: Once | INTRAMUSCULAR | Status: DC | PRN

## 2014-12-19 MED ORDER — SODIUM CHLORIDE 0.9 % IJ SOLN: 9.0000 mL | INTRAMUSCULAR | Status: DC | PRN

## 2014-12-19 MED ORDER — ONDANSETRON HCL 4 MG/2ML IJ SOLN
4.0000 mg | Freq: Four times a day (QID) | INTRAMUSCULAR | Status: DC | PRN
Start: 2014-12-19 — End: 2014-12-19

## 2014-12-19 MED ORDER — MIDAZOLAM HCL 2 MG/2ML IJ SOLN
INTRAMUSCULAR | Status: DC | PRN
  Administered 2014-12-19: 2 mg via INTRAVENOUS

## 2014-12-19 MED ORDER — OXYCODONE-ACETAMINOPHEN 5-325 MG PO TABS: 1.0000 | ORAL_TABLET | ORAL | Status: DC | PRN

## 2014-12-19 MED ORDER — FENTANYL CITRATE (PF) 100 MCG/2ML IJ SOLN
INTRAMUSCULAR | Status: AC
  Administered 2014-12-19: 25 ug via INTRAVENOUS
  Filled 2014-12-19: qty 2

## 2014-12-19 MED ORDER — PROPOFOL 10 MG/ML IV BOLUS
INTRAVENOUS | Status: DC | PRN
  Administered 2014-12-19: 150 mg via INTRAVENOUS

## 2014-12-19 MED ORDER — HYDROMORPHONE 0.3 MG/ML IV SOLN: INTRAVENOUS | Status: DC

## 2014-12-19 MED ORDER — LIDOCAINE HCL (CARDIAC) 20 MG/ML IV SOLN
INTRAVENOUS | Status: DC | PRN
  Administered 2014-12-19: 100 mg via INTRAVENOUS

## 2014-12-19 MED ORDER — DOCUSATE SODIUM 100 MG PO CAPS
100.0000 mg | ORAL_CAPSULE | Freq: Two times a day (BID) | ORAL | Status: DC
  Administered 2014-12-20 – 2014-12-25 (×11): 100 mg via ORAL
  Filled 2014-12-19 (×13): qty 1

## 2014-12-19 MED ORDER — BUDESONIDE 0.5 MG/2ML IN SUSP: 0.2500 mg | Freq: Two times a day (BID) | RESPIRATORY_TRACT | Status: DC

## 2014-12-19 MED ORDER — GLYCOPYRROLATE 0.2 MG/ML IJ SOLN
INTRAMUSCULAR | Status: DC | PRN
  Administered 2014-12-19: .8 mg via INTRAVENOUS

## 2014-12-19 MED ORDER — HYDROMORPHONE HCL 1 MG/ML IJ SOLN
0.2500 mg | INTRAMUSCULAR | Status: DC | PRN
  Administered 2014-12-19 (×4): 0.5 mg via INTRAVENOUS

## 2014-12-19 MED ORDER — DIPHENHYDRAMINE HCL 12.5 MG/5ML PO ELIX
12.5000 mg | ORAL_SOLUTION | Freq: Four times a day (QID) | ORAL | Status: DC | PRN
  Filled 2014-12-19: qty 5

## 2014-12-19 MED ORDER — VANCOMYCIN HCL IN DEXTROSE 1-5 GM/200ML-% IV SOLN
1000.0000 mg | Freq: Once | INTRAVENOUS | Status: DC
  Filled 2014-12-19: qty 200

## 2014-12-19 MED ORDER — VANCOMYCIN HCL 1000 MG IV SOLR
1000.0000 mg | INTRAVENOUS | Status: DC | PRN
  Administered 2014-12-19: 1000 mg via INTRAVENOUS

## 2014-12-19 MED ORDER — SODIUM CHLORIDE 0.9 % IR SOLN
Status: DC | PRN
  Administered 2014-12-19: 1000 mL

## 2014-12-19 MED ORDER — FLUORESCEIN SODIUM 10 % IJ SOLN
INTRAMUSCULAR | Status: AC
  Filled 2014-12-19: qty 5

## 2014-12-19 MED ORDER — ROCURONIUM BROMIDE 100 MG/10ML IV SOLN
INTRAVENOUS | Status: DC | PRN
  Administered 2014-12-19: 20 mg via INTRAVENOUS
  Administered 2014-12-19: 30 mg via INTRAVENOUS
  Administered 2014-12-19: 20 mg via INTRAVENOUS
  Administered 2014-12-19: 50 mg via INTRAVENOUS

## 2014-12-19 MED ORDER — ONDANSETRON HCL 4 MG/2ML IJ SOLN
4.0000 mg | Freq: Four times a day (QID) | INTRAMUSCULAR | Status: DC | PRN
  Administered 2014-12-20 – 2014-12-21 (×2): 4 mg via INTRAVENOUS

## 2014-12-19 MED ORDER — 0.9 % SODIUM CHLORIDE (POUR BTL) OPTIME
TOPICAL | Status: DC | PRN
  Administered 2014-12-19: 400 mL

## 2014-12-19 MED ORDER — FENTANYL CITRATE (PF) 100 MCG/2ML IJ SOLN
INTRAMUSCULAR | Status: DC | PRN
  Administered 2014-12-19 (×3): 50 ug via INTRAVENOUS
  Administered 2014-12-19: 100 ug via INTRAVENOUS
  Administered 2014-12-19 (×2): 50 ug via INTRAVENOUS

## 2014-12-19 MED ORDER — ACETAMINOPHEN 10 MG/ML IV SOLN
INTRAVENOUS | Status: AC
  Filled 2014-12-19: qty 100

## 2014-12-19 MED ORDER — THROMBIN 5000 UNITS EX SOLR
CUTANEOUS | Status: AC
  Filled 2014-12-19: qty 5000

## 2014-12-19 SURGICAL SUPPLY — 64 items
BAG COUNTER SPONGE EZ (MISCELLANEOUS) ×12 IMPLANT
BAG URO DRAIN 2000ML W/SPOUT (MISCELLANEOUS) ×8 IMPLANT
CANISTER SUCT 1200ML W/VALVE (MISCELLANEOUS) ×4 IMPLANT
CATH TRAY 16F METER LATEX (MISCELLANEOUS) ×4 IMPLANT
CATH URETL 5X70 OPEN END (CATHETERS) ×8 IMPLANT
CHLORAPREP W/TINT 26ML (MISCELLANEOUS) ×4 IMPLANT
CNTNR SPEC 2.5X3XGRAD LEK (MISCELLANEOUS) ×3
CONT SPEC 4OZ STER OR WHT (MISCELLANEOUS) ×1
CONTAINER SPEC 2.5X3XGRAD LEK (MISCELLANEOUS) ×3 IMPLANT
COVER LIGHT HANDLE STERIS (MISCELLANEOUS) ×4 IMPLANT
DEVICE DUBIN SPECIMEN MAMMOGRA (MISCELLANEOUS) ×4 IMPLANT
DRAPE LAP W/FLUID (DRAPES) ×4 IMPLANT
DRAPE POUCH INSTRU U-SHP 10X18 (DRAPES) ×4 IMPLANT
DRAPE UNDER BUTTOCK W/FLU (DRAPES) ×4 IMPLANT
DRSG OPSITE POSTOP 4X12 (GAUZE/BANDAGES/DRESSINGS) ×4 IMPLANT
DRSG OPSITE POSTOP 4X6 (GAUZE/BANDAGES/DRESSINGS) ×4 IMPLANT
DRSG TELFA 3X8 NADH (GAUZE/BANDAGES/DRESSINGS) ×4 IMPLANT
ELECT CAUTERY BLADE 6.4 (BLADE) ×4 IMPLANT
GAUZE SPONGE 4X4 12PLY STRL (GAUZE/BANDAGES/DRESSINGS) ×4 IMPLANT
GLOVE BIO SURGEON STRL SZ 6.5 (GLOVE) ×16 IMPLANT
GLOVE BIOGEL PI IND STRL 6.5 (GLOVE) ×3 IMPLANT
GLOVE BIOGEL PI INDICATOR 6.5 (GLOVE) ×1
GLOVE INDICATOR 6.5 STRL GRN (GLOVE) ×4 IMPLANT
GLOVE INDICATOR 7.0 STRL GRN (GLOVE) ×16 IMPLANT
GLOVE SKINSENSE NS SZ7.5 (GLOVE) ×1
GLOVE SKINSENSE STRL SZ7.5 (GLOVE) ×3 IMPLANT
GOWN STRL REUS W/ TWL LRG LVL3 (GOWN DISPOSABLE) ×9 IMPLANT
GOWN STRL REUS W/ TWL LRG LVL4 (GOWN DISPOSABLE) ×6 IMPLANT
GOWN STRL REUS W/ TWL XL LVL3 (GOWN DISPOSABLE) ×3 IMPLANT
GOWN STRL REUS W/TWL LRG LVL3 (GOWN DISPOSABLE) ×3
GOWN STRL REUS W/TWL LRG LVL4 (GOWN DISPOSABLE) ×2
GOWN STRL REUS W/TWL XL LVL3 (GOWN DISPOSABLE) ×1
IV LACTATED RINGERS 1000ML (IV SOLUTION) ×4 IMPLANT
JELLY LUB 2OZ STRL (MISCELLANEOUS) ×1
JELLY LUBE 2OZ STRL (MISCELLANEOUS) ×3 IMPLANT
KIT RM TURNOVER CYSTO AR (KITS) ×4 IMPLANT
LABEL OR SOLS (LABEL) ×4 IMPLANT
LIGASURE IMPACT 36 18CM CVD LR (INSTRUMENTS) ×4 IMPLANT
MONOCRYL ×4 IMPLANT
NS IRRIG 500ML POUR BTL (IV SOLUTION) ×8 IMPLANT
PACK BASIN MAJOR ARMC (MISCELLANEOUS) ×4 IMPLANT
PACK CYSTO AR (MISCELLANEOUS) ×8 IMPLANT
PAD GROUND ADULT SPLIT (MISCELLANEOUS) ×4 IMPLANT
PAD PREP 24X41 OB/GYN DISP (PERSONAL CARE ITEMS) ×4 IMPLANT
SET CYSTO W/LG BORE CLAMP LF (SET/KITS/TRAYS/PACK) ×8 IMPLANT
SKIN STAPLES ×8 IMPLANT
SOL PREP PVP 2OZ (MISCELLANEOUS) ×4
SOLUTION PREP PVP 2OZ (MISCELLANEOUS) ×3 IMPLANT
SPOGE SURGIFLO 8M (HEMOSTASIS) ×2
SPONGE LAP 18X18 5 PK (GAUZE/BANDAGES/DRESSINGS) ×20 IMPLANT
SPONGE SURGIFLO 8M (HEMOSTASIS) ×6 IMPLANT
SPONGE XRAY 4X4 16PLY STRL (MISCELLANEOUS) ×8 IMPLANT
STAPLER SKIN PROX 35W (STAPLE) ×4 IMPLANT
SUT PDS AB 1 TP1 54 (SUTURE) ×8 IMPLANT
SUT VIC AB 0 CT1 27 (SUTURE) ×2
SUT VIC AB 0 CT1 27XCR 8 STRN (SUTURE) ×6 IMPLANT
SUT VIC AB 0 CT1 36 (SUTURE) ×8 IMPLANT
SUT VIC AB 2-0 SH 27 (SUTURE) ×1
SUT VIC AB 2-0 SH 27XBRD (SUTURE) ×3 IMPLANT
SUT VICRYL PLUS ABS 0 54 (SUTURE) IMPLANT
SYR 50ML LL SCALE MARK (SYRINGE) ×4 IMPLANT
SYR BULB IRRIG 60ML STRL (SYRINGE) ×4 IMPLANT
SYRINGE IRR TOOMEY STRL 70CC (SYRINGE) ×4 IMPLANT
WATER STERILE IRR 1000ML POUR (IV SOLUTION) ×12 IMPLANT

## 2014-12-19 NOTE — Anesthesia Preprocedure Evaluation (Addendum)
Anesthesia Evaluation  Patient identified by MRN, date of birth, ID band Patient awake    Reviewed: Allergy & Precautions, NPO status , Patient's Chart, lab work & pertinent test results  Airway Mallampati: III  TM Distance: >3 FB Neck ROM: Full    Dental  (+) Partial Upper, Poor Dentition, Chipped   Pulmonary asthma , Current Smoker,          Cardiovascular     Neuro/Psych    GI/Hepatic   Endo/Other  Morbid obesity  Renal/GU      Musculoskeletal   Abdominal   Peds  Hematology   Anesthesia Other Findings   Reproductive/Obstetrics                            Anesthesia Physical Anesthesia Plan  ASA: III  Anesthesia Plan: General   Post-op Pain Management:    Induction: Intravenous  Airway Management Planned: Oral ETT  Additional Equipment:   Intra-op Plan:   Post-operative Plan:   Informed Consent: I have reviewed the patients History and Physical, chart, labs and discussed the procedure including the risks, benefits and alternatives for the proposed anesthesia with the patient or authorized representative who has indicated his/her understanding and acceptance.     Plan Discussed with: CRNA  Anesthesia Plan Comments:         Anesthesia Quick Evaluation

## 2014-12-19 NOTE — Anesthesia Postprocedure Evaluation (Signed)
  Anesthesia Post-op Note  Patient: Vanessa Caldwell  Procedure(s) Performed: Procedure(s): laparotomy with staging  and lysis of adhesions (N/A) OOPHORECTOMY Bilateral (Bilateral) EXAM UNDER ANESTHESIA (N/A) CYSTOSCOPY with possible stent placement (Bilateral) OMENTECTOMY CYSTOSCOPY WITH STENT REMOVAL (Bilateral)  Anesthesia type:General  Patient location: PACU  Post pain: Pain level controlled  Post assessment: Post-op Vital signs reviewed, Patient's Cardiovascular Status Stable, Respiratory Function Stable, Patent Airway and No signs of Nausea or vomiting  Post vital signs: Reviewed and stable  Last Vitals:  Filed Vitals:   12/19/14 1815  BP:   Pulse: 68  Temp:   Resp: 12    Level of consciousness: awake, alert  and patient cooperative  Complications: No apparent anesthesia complications

## 2014-12-19 NOTE — Progress Notes (Signed)
Obstetric and Gynecology  Subjective  Vanessa Caldwell is a 49 y.o. female G6P3 who presented on 12/18/2014 for bilateral adnexal masses with significant pelvic pain, bilateral flank tenderness and oliguria.   Overnight, the patient required Dilaudid PCA to control her pain. Urine output has been adequate, >142ml/hr. Her vitals are abnormal for bradycardia while sleeping with spontaneous resolution and elevated BP to 141/73 on admission, but resolution to normotensive after initiation of PCA.  She states that flank pain is worse now 4/10 and that the PCA is helping.   Of note, her Is and Os recorded are inaccurate, with good urine output and 159ml/hr LR running.  She remains afebrile.  Objective   Filed Vitals:   12/19/14 0858  BP: 125/78  Pulse: 56  Temp: 97.5 F (36.4 C)  Resp: 20     Intake/Output Summary (Last 24 hours) at 12/19/14 1047 Last data filed at 12/19/14 0420  Gross per 24 hour  Intake   7.67 ml  Output   1400 ml  Net -1392.33 ml    General: NAD Cardiovascular: RRR, no murmurs Pulmonary: CTAB Abdomen: Benign. Non-tender, +BS, +guarding bilateral flanks with continued CVA tenderness. Extremities: No erythema or cords, no calf tenderness, +warmth with normal peripheral pulses. SCDs in place.  Labs: Results for orders placed or performed during the hospital encounter of 12/18/14 (from the past 24 hour(s))  Comprehensive metabolic panel     Status: Abnormal   Collection Time: 12/18/14  8:05 PM  Result Value Ref Range   Sodium 138 135 - 145 mmol/L   Potassium 3.4 (L) 3.5 - 5.1 mmol/L   Chloride 105 101 - 111 mmol/L   CO2 26 22 - 32 mmol/L   Glucose, Bld 148 (H) 65 - 99 mg/dL   BUN 15 6 - 20 mg/dL   Creatinine, Ser 0.90 0.44 - 1.00 mg/dL   Calcium 8.9 8.9 - 10.3 mg/dL   Total Protein 7.4 6.5 - 8.1 g/dL   Albumin 4.0 3.5 - 5.0 g/dL   AST 24 15 - 41 U/L   ALT 12 (L) 14 - 54 U/L   Alkaline Phosphatase 79 38 - 126 U/L   Total Bilirubin 0.4 0.3 - 1.2 mg/dL   GFR calc non Af Amer >60 >60 mL/min   GFR calc Af Amer >60 >60 mL/min   Anion gap 7 5 - 15  Lipase     Status: None   Collection Time: 12/18/14  8:05 PM  Result Value Ref Range   Lipase 35 22 - 51 U/L  CBC WITH DIFFERENTIAL     Status: None   Collection Time: 12/18/14  8:05 PM  Result Value Ref Range   WBC 7.1 3.6 - 11.0 K/uL   RBC 3.88 3.80 - 5.20 MIL/uL   Hemoglobin 12.5 12.0 - 16.0 g/dL   HCT 36.9 35.0 - 47.0 %   MCV 94.9 80.0 - 100.0 fL   MCH 32.1 26.0 - 34.0 pg   MCHC 33.8 32.0 - 36.0 g/dL   RDW 12.8 11.5 - 14.5 %   Platelets 193 150 - 440 K/uL   Neutrophils Relative % 60 %   Neutro Abs 4.3 1.4 - 6.5 K/uL   Lymphocytes Relative 31 %   Lymphs Abs 2.2 1.0 - 3.6 K/uL   Monocytes Relative 5 %   Monocytes Absolute 0.4 0.2 - 0.9 K/uL   Eosinophils Relative 3 %   Eosinophils Absolute 0.2 0 - 0.7 K/uL   Basophils Relative 1 %   Basophils  Absolute 0.0 0 - 0.1 K/uL  Protime-INR     Status: None   Collection Time: 12/18/14  8:05 PM  Result Value Ref Range   Prothrombin Time 13.2 11.4 - 15.0 seconds   INR 0.98   Lactate dehydrogenase     Status: None   Collection Time: 12/18/14  8:05 PM  Result Value Ref Range   LDH 126 98 - 192 U/L  Urinalysis complete, with microscopic [facility])     Status: Abnormal   Collection Time: 12/18/14  9:17 PM  Result Value Ref Range   Color, Urine YELLOW (A) YELLOW   APPearance CLEAR (A) CLEAR   Glucose, UA NEGATIVE NEGATIVE mg/dL   Bilirubin Urine NEGATIVE NEGATIVE   Ketones, ur NEGATIVE NEGATIVE mg/dL   Specific Gravity, Urine 1.019 1.005 - 1.030   Hgb urine dipstick NEGATIVE NEGATIVE   pH 5.0 5.0 - 8.0   Protein, ur NEGATIVE NEGATIVE mg/dL   Nitrite NEGATIVE NEGATIVE   Leukocytes, UA TRACE (A) NEGATIVE   RBC / HPF 0-5 0 - 5 RBC/hpf   WBC, UA 6-30 0 - 5 WBC/hpf   Bacteria, UA RARE (A) NONE SEEN   Squamous Epithelial / LPF 0-5 (A) NONE SEEN   Mucous PRESENT     Cultures: Results for orders placed or performed during the hospital  encounter of 11/18/14  Wet prep, genital     Status: None   Collection Time: 11/18/14 11:17 PM  Result Value Ref Range Status   Micro Text Report   Final       COMMENT                   RARE WHITE BLOOD CELLS SEEN   COMMENT                   NO TRICHOMONAS,SPERMATOZOA,YEAST,OR CLUE CELLS SEEN   ANTIBIOTIC                                                      GC/Chlamydia Probe Amp     Status: None   Collection Time: 11/18/14 11:17 PM  Result Value Ref Range Status   Micro Text Report   Final       CHLAMYDIA                 CHLAMYDIA TRACHOMATIS NEGATIVE   N.GONORRHOEAE             N.GONORRHOEAE NEGATIVE   ANTIBIOTIC                                                        Imaging: Ct Abdomen Pelvis W Contrast  12/19/2014   CLINICAL DATA:  Bilateral costovertebral angle tenderness.  EXAM: CT ABDOMEN AND PELVIS WITH CONTRAST  TECHNIQUE: Multidetector CT imaging of the abdomen and pelvis was performed using the standard protocol following bolus administration of intravenous contrast.  CONTRAST:  114mL OMNIPAQUE IOHEXOL 350 MG/ML SOLN  COMPARISON:  None.  FINDINGS: BODY WALL: No contributory findings.  LOWER CHEST: No contributory findings.  ABDOMEN/PELVIS:  Liver: No focal abnormality.  Biliary: No evidence of biliary obstruction or stone.  Pancreas: Unremarkable.  Spleen: Unremarkable.  Adrenals: Unremarkable.  Kidneys and ureters: No hydronephrosis or stone.  Bladder: Unremarkable.  Reproductive: There is a cystic mass in the right pelvis with thick septations, 12.4 cm in maximal diameter. This mass is indistinguishable from the right ovary. The left ovary also contains a cystic mass, 50 mm in maximal diameter with a possible septations centrally. Status post hysterectomy. There is no retroperitoneal adenopathy, ascites, or peritoneal nodularity.  Bowel: Mild distal colonic diverticulosis. Normal appendix.  Vascular: No acute abnormality.  OSSEOUS: Lower lumbar facet arthropathy, greater on  the right at L4-5 and L5-S1.  IMPRESSION: 12 cm complex cystic right pelvic/ ovarian mass, primarily concerning for an epithelial neoplasm. 5 cm septated left ovarian mass. Recommend surgical consultation.   Electronically Signed   By: Monte Fantasia M.D.   On: 12/19/2014 01:36     Assessment   49 y.o. [facility] Day: 2   Plan   1. Surgical consents: On admission, pt was reporting oliguria/anuria x1 week with gross hematuria, bilateral severe flank pain at CVA and I was concerned for ureteral obstruction with hydronephrosis. CT scan with po and iv contrast revealed no renal abnormalities. Imaging also confirms bilateral ovarian masses, largest 12 cm on right side with thick septations, though no peritoneal nodularity, pelvic ascites, or evidence of distant mets.   Her BUN/Cr labs were reassuring as well. H/H, WBC wnl. Lipase normal. Tumor markers sent and pending.  I spoke with GynOnc on call Dr. Theora Gianotti who is available for intraop consultation if required. With a normal CA125, the risk of cancer is lower. I have sent off Inhibin B labs to eval for granulosa cell tumors prior to the procedure. We will sent intraop frozen sections.  Pt is consented for add on case today for ex lap with exam under anesthesia, bilateral oopherectomy, pelvic washings with peritoneal biopsies. I will also place ureteral stents prior to the start of the procedure, as she has a complicated surgical hx and the possibility of adhesions with ovarian distortion of anatomy is high.  Consent for surgery and blood consents signed. We did discuss my concerns and the anticipated surgery in the clinic yesterday, and I went over particulars of her case with her today.  2. 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 She admits to [facility] use at this time. Drug screen pending. Will be mindful of postop pain.  3. Pt to see anesthesia, with hx of asthma, current smoking.  4. She states need for  hospitalization after blood transfusion in past, but does not remember reaction. If blood needed, will premedicate and monitor closely.  5. Plan for postop Lovenox x6 weeks for extensive open surgery and immobility.

## 2014-12-19 NOTE — OR Nursing (Signed)
surgiflo with 5000 units of thrombin , rectosigmoid  By Dr. Leafy Ro

## 2014-12-19 NOTE — Anesthesia Procedure Notes (Signed)
Procedure Name: Intubation Date/Time: 12/19/2014 11:59 AM Performed by: Doreen Salvage Pre-anesthesia Checklist: Patient identified, Patient being monitored, Timeout performed, Emergency Drugs available and Suction available Patient Re-evaluated:Patient Re-evaluated prior to inductionOxygen Delivery Method: Circle system utilized Preoxygenation: Pre-oxygenation with 100% oxygen Intubation Type: IV induction Ventilation: Mask ventilation without difficulty Laryngoscope Size: Mac and 3 Grade View: Grade II Tube type: Oral Tube size: 7.0 mm Number of attempts: 1 Airway Equipment and Method: Stylet Placement Confirmation: ETT inserted through vocal cords under direct vision,  positive ETCO2 and breath sounds checked- equal and bilateral Secured at: 22 cm Tube secured with: Tape Dental Injury: Teeth and Oropharynx as per pre-operative assessment

## 2014-12-19 NOTE — Op Note (Signed)
Vanessa Caldwell PROCEDURE DATE: 12/19/2014  PREOPERATIVE DIAGNOSIS:  Bilateral ovarian masses, pelvic pain, flank pain POSTOPERATIVE DIAGNOSIS:   Bilateral ovarian masses, pelvic pain, flank pain  SURGEON:   Benjaman Kindler, MD ASSISTANT: Laverta Baltimore, MS PROCEDURE:   1. Exam under anesthesia 2. Cystoscopy with stent placement 3. Exploratory laparotomy infraumbilical vertical incision 4. Extensive lysis of adhesions, comprising much more than 50% of the operative time 5. Partial omentectomy 6. Bilateral oopherectomy 7. Evaluation for rectal integrity 8. Cystoscopy with stent removal   ANESTHESIA:  General endotracheal.  INDICATIONS: The patient is a 49 y.o. G6P3 with the aforementioned diagnoses who desires definitive surgical management. On the preoperative visit, the risks, benefits, indications, and alternatives of the procedure were reviewed with the patient.  On the day of surgery, the risks of surgery were again discussed with the patient including but not limited to: bleeding which may require transfusion or reoperation; infection which may require antibiotics; injury to bowel, bladder, ureters or other surrounding organs; need for additional procedures; thromboembolic phenomenon, incisional problems and other postoperative/anesthesia complications. Written informed consent was obtained.    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 SPECIMENS:  Pelvic washings,  ommental biopsy, bilateral ovaries - right to frozen section, left permanent COMPLICATIONS:  None immediate.  DESCRIPTION OF PROCEDURE: The patient received intravenous gentamycin 1 g. Sequential compression devices were unable to be applied to her lower extremities while in the preoperative area because of her leg size; the large sized SCDs could not fit.   She was taken to the operating room and placed under general anesthesia without difficulty.  The abdomen and perineum were prepped and draped in a sterile manner, and she was placed in a dorsal lithotomy position.    Cystoscopy was performed and 5 french open tip ureteral stents were placed bilaterally without complication. A Foley catheter was inserted into the bladder and attached to constant drainage.   After an adequate timeout was performed, an infraumbilical vertical skin incision was made. This incision was taken down to the fascia using electrocautery with care given to maintain good hemostasis. The fascia was incised in the midline and the fascial incision was then extended superiorly and inferiorly using electrocautery without difficulty.  The omentum was noted to be immediately adherent and required excision to continue to enter the peritoneal cavity. This peritoneal incision was then extended superiorly and inferiorly with care given to prevent bowel or bladder injury. Attention was then turned to the pelvis. A Bookwalter self-retaining retractor was placed into the incision, and the bowel was packed away with moist laparotomy sponges.  Pelvic washings were collected.   The bowel was packed away with moist laparotomy sponges. The large right ovarian mass was compltely not mobile, and careful blunt, sharp and electrocautery dissection was used to mobilize the tissue, being careful at all times to avoid the ureter and the IP ligament. The IP ligament was cauterized with a Ligasure instrument.   While dissecting the anterior portion of this  mass below the bladder, the simple cystic portion was entered  unintentionally. Extensive adhesions made this dissection very difficult.   Once the right ovary and cyst were removed from the pelvis, attention was turned to the left pelvic sidewall. The rectosigmoid was dissected off the wall, and a cystic lef tovary was noted to be well-adhesed. 45 min of careful dissection was used to free the left ovary, being aware of the ureter deep to the dissection at all critical points of surgery. The left IP ligament was removed after Ligasure cautery.  The pelvis was irrigated and hemostasis was reconfirmed at all pedicles and along the pelvic sidewall.  The pelvis was filled with sterile water and air pressed into the rectum; no arising bubbles were noted. SurgiFlow was placed in the posterior cul-de-sac to assist with hemostasis.  Attention was turned to the urethera, and the stents removed. Cystoscopy confirmed bilateral ureteral efflux and bladder integrity.  All laparotomy sponges and instruments were removed from the abdomen. The fascia, rectus muscles and peritoneum were closed in a mass fashion using 2 looped 1 PDS suture in a running fashion and tied in the midline. The subcutaneous skin was irrigated and brought together with 0-Vicryl stiches. The skin was closed with staples.  Sponge, lap, needle, and instrument counts were correct times four. The patient was taken to the recovery area awake, extubated and in stable condition.  Postop plan: PCA, Lovenox in 24 hrs, serial hgb. 1/2 staple removal in 2 weeks, final staples removed in 3-4 weeks.

## 2014-12-19 NOTE — Transfer of Care (Signed)
Immediate Anesthesia Transfer of Care Note  Patient: Vanessa Caldwell  Procedure(s) Performed: Procedure(s): laparotomy with staging  and lysis of adhesions (N/A) OOPHORECTOMY Bilateral (Bilateral) EXAM UNDER ANESTHESIA (N/A) CYSTOSCOPY with possible stent placement (Bilateral)  Patient Location: PACU  Anesthesia Type:General  Level of Consciousness: awake  Airway & Oxygen Therapy: Patient Spontanous Breathing and Patient connected to face mask oxygen  Post-op Assessment: Report given to RN  Post vital signs: Reviewed  Last Vitals:  Filed Vitals:   12/19/14 1748  BP: 130/99  Pulse: 72  Temp: 36.3 C  Resp: 13    Complications: No apparent anesthesia complications

## 2014-12-19 NOTE — Progress Notes (Signed)
Called to patient's bedside for reports of chest tightness and uncontrolled pain. Feels that she needs to burp without being able to. PCA settings currently at 0.3mg /q65min for max of 1.25mg /hr.  Subjective: Patient reports 10/10 pain   Objective: I have reviewed patient's vital signs, intake and output, medications and labs.  Filed Vitals:   12/19/14 1924 12/19/14 2012 12/19/14 2151 12/19/14 2221  BP: 145/75  149/117 168/86  Pulse: 61  71 58  Temp: 97.5 F (36.4 C)   97.5 F (36.4 C)  TempSrc:    Oral  Resp: 16  18   SpO2: 95% 97% 96% 97%    General: moderate distress and slightly lethargic but not obtunded Resp: clear to auscultation bilaterally Cardio: regular rate and rhythm, S1, S2 normal, no murmur, click, rub or gallop GI: incision: clean, dry and intact and decreased bowel sounds Extremities: SCDs intact, no calf tenderness, no erythema Urine red, as expected after ureteral stents.   Assessment/Plan: Pain control inadequate.  VSS, not tachycardic, not hypotensive and O2 sats appropriate on 2 liters. No evidence of DVT or PE. No evidence of cardiopulm compromise. No evidence of continued bleeding.   1. Titrate PCA settings to adequate pain control with safety in mind.  2. Nursing staff unable to maintain level of care because of multiple patient responsibilities with this patient who is requiring closer attention. Per nursing request, will transfer to higher level of care. Administrative coordinator aware and investigating options. 3. Talked to patient and husband in room. All questions about surgery and current care answered.   LOS: 0 days    Vanessa Caldwell 12/19/2014, 11:36 PM

## 2014-12-19 NOTE — Progress Notes (Signed)
POD#0 - Chart Rounds Procedure(s) (LRB): laparotomy with extensive lysis of adhesions (N/A) OOPHORECTOMY Bilateral (Bilateral) EXAM UNDER ANESTHESIA (N/A) CYSTOSCOPY with stent placement (Bilateral) OMENTECTOMY CYSTOSCOPY WITH STENT REMOVAL (Bilateral)  Objective: I have reviewed patient's vital signs, is/os and pain score. Pt is using PCA for pain control.  Foley in place - appropriate UOP NPO overnight   Assessment: s/p Procedure(s): laparotomy with staging  and lysis of adhesions (N/A) OOPHORECTOMY Bilateral (Bilateral) EXAM UNDER ANESTHESIA (N/A) CYSTOSCOPY with possible stent placement (Bilateral) OMENTECTOMY CYSTOSCOPY WITH STENT REMOVAL (Bilateral):   Plan:  Labs in morning, serial blood counts Pain control. Wean PCA over next 1-2 days to po pain meds Out of bed early IV fluids LR at 128ml/Hr while NPO Lovenox 40mg  /day while not ambulatory, starting after am labs (24hrs after procedure) for DVT ppx. SCDs in place now. Incentive spirometry Pathology pending ADAT    LOS: 0 days    Vanessa Caldwell 12/19/2014, 9:36 PM

## 2014-12-20 ENCOUNTER — Encounter: Payer: Self-pay | Admitting: Obstetrics and Gynecology

## 2014-12-20 LAB — CBC
HCT: 33.5 % — ABNORMAL LOW (ref 35.0–47.0)
Hemoglobin: 11.4 g/dL — ABNORMAL LOW (ref 12.0–16.0)
MCH: 32.1 pg (ref 26.0–34.0)
MCHC: 34.1 g/dL (ref 32.0–36.0)
MCV: 94.1 fL (ref 80.0–100.0)
Platelets: 191 10*3/uL (ref 150–440)
RBC: 3.56 MIL/uL — AB (ref 3.80–5.20)
RDW: 12.6 % (ref 11.5–14.5)
WBC: 14.2 10*3/uL — ABNORMAL HIGH (ref 3.6–11.0)

## 2014-12-20 LAB — BASIC METABOLIC PANEL
ANION GAP: 5 (ref 5–15)
BUN: 9 mg/dL (ref 6–20)
CALCIUM: 8.6 mg/dL — AB (ref 8.9–10.3)
CO2: 28 mmol/L (ref 22–32)
Chloride: 105 mmol/L (ref 101–111)
Creatinine, Ser: 0.8 mg/dL (ref 0.44–1.00)
GFR calc Af Amer: >60 mL/min (ref >60)
GLUCOSE: 143 mg/dL — AB (ref 65–99)
Potassium: 4.2 mmol/L (ref 3.5–5.1)
Sodium: 138 mmol/L (ref 135–145)

## 2014-12-20 LAB — CEA: CEA: 0.6 ng/mL (ref 0.0–4.7)

## 2014-12-20 LAB — AFP TUMOR MARKER: AFP TUMOR MARKER: 4.7 ng/mL (ref 0.0–8.3)

## 2014-12-20 LAB — ABO/RH: ABO/RH(D): A POS

## 2014-12-20 MED ORDER — HYDROMORPHONE 0.3 MG/ML IV SOLN
INTRAVENOUS | Status: DC
  Administered 2014-12-20: 4.36 mg via INTRAVENOUS
  Administered 2014-12-20: 5.1 mg via INTRAVENOUS
  Administered 2014-12-20: 2.49 mg via INTRAVENOUS
  Administered 2014-12-20 (×2): 4.79 mg via INTRAVENOUS
  Administered 2014-12-20: 34.74 mL via INTRAVENOUS
  Administered 2014-12-20: 3.9 mg via INTRAVENOUS
  Administered 2014-12-21: 9.23 mg via INTRAVENOUS
  Administered 2014-12-21: 21.7 mg via INTRAVENOUS
  Administered 2014-12-21: 62.8 mg via INTRAVENOUS
  Filled 2014-12-20 (×5): qty 25

## 2014-12-20 NOTE — Progress Notes (Signed)
1 Day Post-Op Procedure(s) (LRB): laparotomy with staging  and lysis of adhesions (N/A) OOPHORECTOMY Bilateral (Bilateral) EXAM UNDER ANESTHESIA (N/A) CYSTOSCOPY with possible stent placement (Bilateral) OMENTECTOMY CYSTOSCOPY WITH STENT REMOVAL (Bilateral)  Subjective: Patient reports incisional pain.    Objective: I have reviewed patient's vital signs, intake and output, medications and labs.  General: alert, cooperative and fatigued Resp: clear to auscultation bilaterally Cardio: regular rate and rhythm, S1, S2 normal, no murmur, click, rub or gallop GI: soft, non-tender; bowel sounds normal; no masses,  no organomegaly and incision: clean, dry and intact Extremities: extremities normal, atraumatic, no cyanosis or edema, no edema, redness or tenderness in the calves or thighs and SCDs  Assessment: s/p Procedure(s): laparotomy with staging  and lysis of adhesions (N/A) OOPHORECTOMY Bilateral (Bilateral) EXAM UNDER ANESTHESIA (N/A) CYSTOSCOPY with possible stent placement (Bilateral) OMENTECTOMY CYSTOSCOPY WITH STENT REMOVAL (Bilateral): stable  Plan: Advance diet Encourage ambulation Titrate PCA down  LOS: 1 day    Vanessa Caldwell 12/20/2014, 7:31 AM

## 2014-12-20 NOTE — Progress Notes (Signed)
Chart reviewed: Pt doing well per nursing staff. VSS- low heart rate and BP consistent with prior values, with elevated BP during times of poor pain control. PCA running with basal rate of 1mg /hr. O2 sats 100%. Foley cath in place. SCA in place.   Will plan for wean PCA tomorrow, OOB to chair and d/c Foley. Will start Lovenox if H/H stable for DVT ppx while not ambulatory. Continue incentive spirometry.

## 2014-12-21 LAB — CBC
HEMATOCRIT: 29.7 % — AB (ref 35.0–47.0)
Hemoglobin: 10.1 g/dL — ABNORMAL LOW (ref 12.0–16.0)
MCH: 32.2 pg (ref 26.0–34.0)
MCHC: 34 g/dL (ref 32.0–36.0)
MCV: 94.6 fL (ref 80.0–100.0)
Platelets: 158 10*3/uL (ref 150–440)
RBC: 3.14 MIL/uL — AB (ref 3.80–5.20)
RDW: 12.7 % (ref 11.5–14.5)
WBC: 8.7 10*3/uL (ref 3.6–11.0)

## 2014-12-21 LAB — BASIC METABOLIC PANEL
ANION GAP: 3 — AB (ref 5–15)
BUN: 10 mg/dL (ref 6–20)
CHLORIDE: 104 mmol/L (ref 101–111)
CO2: 31 mmol/L (ref 22–32)
Calcium: 8.5 mg/dL — ABNORMAL LOW (ref 8.9–10.3)
Creatinine, Ser: 0.85 mg/dL (ref 0.44–1.00)
GFR calc Af Amer: >60 mL/min (ref >60)
GFR calc non Af Amer: >60 mL/min (ref >60)
Glucose, Bld: 116 mg/dL — ABNORMAL HIGH (ref 65–99)
Potassium: 3.5 mmol/L (ref 3.5–5.1)
SODIUM: 138 mmol/L (ref 135–145)

## 2014-12-21 MED ORDER — HYDROMORPHONE 0.3 MG/ML IV SOLN
INTRAVENOUS | Status: DC
  Administered 2014-12-21: 2.4 mg via INTRAVENOUS
  Administered 2014-12-22: 48.95 mg via INTRAVENOUS
  Administered 2014-12-22: 4.7 mg via INTRAVENOUS
  Administered 2014-12-22: 0.6 mg via INTRAVENOUS
  Filled 2014-12-21 (×2): qty 25

## 2014-12-21 NOTE — Progress Notes (Signed)
2 Days Post-Op Procedure(s) (LRB): laparotomy with staging  and lysis of adhesions (N/A) OOPHORECTOMY Bilateral (Bilateral) EXAM UNDER ANESTHESIA (N/A) CYSTOSCOPY with possible stent placement (Bilateral) OMENTECTOMY CYSTOSCOPY WITH STENT REMOVAL (Bilateral)  Subjective: Patient reports gas pain, abdominal pain that she thinks is related to gas. Pain in abdomen, bloating.   Objective: I have reviewed patient's vital signs, intake and output, medications and labs.  General: alert, cooperative, moderate distress and morbidly obese Resp: clear to auscultation bilaterally and normal percussion bilaterally Cardio: regular rate and rhythm, S1, S2 normal, no murmur, click, rub or gallop GI: abnormal findings:  distended, guarding and obese. Incision dressing clean, dry and staple intact. Extremities: extremities normal, atraumatic, no cyanosis or edema and Homans sign is negative, no sign of DVT  Assessment: s/p Procedure(s): laparotomy with staging  and lysis of adhesions (N/A) OOPHORECTOMY Bilateral (Bilateral) EXAM UNDER ANESTHESIA (N/A) CYSTOSCOPY with possible stent placement (Bilateral) OMENTECTOMY CYSTOSCOPY WITH STENT REMOVAL (Bilateral): not tolerating diet  Plan: Pt appears uncomfortable from bloating and gas. She is distended. Her WBC is normal and lower than POD#1, her O2 sats are 100% and she is not tachycardic. Her Hgb, however, has dropped another 1 g. Currently will manage conservatively. DDx includes ileus and intraperitoneal bleeding, which is less likely as per nursing staff she was uncomfortable from pain but not guarding about 30 min ago. We will remove her Foley cath, get her up and moving and voiding, and treat currently with simethicone and Colace. Continue to monitor closely. NPO if becomes nauseated.   Encourage incentive spirometry.  Advance diet as tolerated Encourage ambulation Advance to PO medication if tolerated. Discontinue basal dilaudid  PCA Discontinue foley and ambulate    LOS: 2 days    Dearion Huot 12/21/2014, 12:58 PM

## 2014-12-22 ENCOUNTER — Encounter: Payer: Self-pay | Admitting: Obstetrics and Gynecology

## 2014-12-22 LAB — TYPE AND SCREEN
ABO/RH(D): A POS
ANTIBODY SCREEN: NEGATIVE
UNIT DIVISION: 0
Unit division: 0

## 2014-12-22 LAB — CBC
HEMATOCRIT: 27.7 % — AB (ref 35.0–47.0)
Hemoglobin: 9.3 g/dL — ABNORMAL LOW (ref 12.0–16.0)
MCH: 31.7 pg (ref 26.0–34.0)
MCHC: 33.7 g/dL (ref 32.0–36.0)
MCV: 94.3 fL (ref 80.0–100.0)
Platelets: 150 10*3/uL (ref 150–440)
RBC: 2.93 MIL/uL — ABNORMAL LOW (ref 3.80–5.20)
RDW: 12.5 % (ref 11.5–14.5)
WBC: 6.5 10*3/uL (ref 3.6–11.0)

## 2014-12-22 LAB — BASIC METABOLIC PANEL
ANION GAP: 5 (ref 5–15)
BUN: 7 mg/dL (ref 6–20)
CALCIUM: 8.5 mg/dL — AB (ref 8.9–10.3)
CHLORIDE: 102 mmol/L (ref 101–111)
CO2: 32 mmol/L (ref 22–32)
Creatinine, Ser: 0.82 mg/dL (ref 0.44–1.00)
GFR calc Af Amer: >60 mL/min (ref >60)
GFR calc non Af Amer: >60 mL/min (ref >60)
GLUCOSE: 97 mg/dL (ref 65–99)
POTASSIUM: 3.3 mmol/L — AB (ref 3.5–5.1)
Sodium: 139 mmol/L (ref 135–145)

## 2014-12-22 MED ORDER — DIPHENHYDRAMINE HCL 12.5 MG/5ML PO ELIX: 12.5000 mg | ORAL_SOLUTION | Freq: Four times a day (QID) | ORAL | Status: DC | PRN

## 2014-12-22 MED ORDER — SODIUM CHLORIDE 0.9 % IJ SOLN: 9.0000 mL | INTRAMUSCULAR | Status: DC | PRN

## 2014-12-22 MED ORDER — HYDROMORPHONE HCL 1 MG/ML IJ SOLN: 1.0000 mg | INTRAMUSCULAR | Status: DC | PRN

## 2014-12-22 MED ORDER — DIPHENHYDRAMINE HCL 50 MG/ML IJ SOLN: 12.5000 mg | Freq: Four times a day (QID) | INTRAMUSCULAR | Status: DC | PRN

## 2014-12-22 MED ORDER — METOCLOPRAMIDE HCL 5 MG/ML IJ SOLN: 10.0000 mg | Freq: Four times a day (QID) | INTRAMUSCULAR | Status: DC | PRN

## 2014-12-22 MED ORDER — ONDANSETRON HCL 4 MG/2ML IJ SOLN: 4.0000 mg | Freq: Four times a day (QID) | INTRAMUSCULAR | Status: DC | PRN

## 2014-12-22 MED ORDER — HYDROMORPHONE 0.3 MG/ML IV SOLN
INTRAVENOUS | Status: DC
  Administered 2014-12-22: 2.4 mg via INTRAVENOUS
  Administered 2014-12-22: 2.7 mg via INTRAVENOUS
  Administered 2014-12-22: 8 mg via INTRAVENOUS
  Administered 2014-12-23: 2.1 mg via INTRAVENOUS
  Administered 2014-12-23: 1.8 mg via INTRAVENOUS
  Administered 2014-12-23: 1.5 mg via INTRAVENOUS
  Filled 2014-12-22: qty 25

## 2014-12-22 MED ORDER — NALOXONE HCL 0.4 MG/ML IJ SOLN: 0.4000 mg | INTRAMUSCULAR | Status: DC | PRN

## 2014-12-22 MED ORDER — HYDROMORPHONE HCL 1 MG/ML IJ SOLN: 0.5000 mg | INTRAMUSCULAR | Status: DC | PRN

## 2014-12-22 NOTE — Progress Notes (Signed)
3 Days Post-Op Procedure(s) (LRB): laparotomy with staging  and lysis of adhesions (N/A) OOPHORECTOMY Bilateral (Bilateral) EXAM UNDER ANESTHESIA (N/A) CYSTOSCOPY with possible stent placement (Bilateral) OMENTECTOMY CYSTOSCOPY WITH STENT REMOVAL (Bilateral)  Subjective: Patient reports nausea and incisional pain.  And bloating. Voiding spontaneously and well. Pain controlled.   Objective: I have reviewed patient's vital signs, intake and output, medications and labs.  General: alert, cooperative, mild distress and morbidly obese Resp: clear to auscultation bilaterally and decreased breath sounds bilateral lung bases Cardio: regular rate and rhythm, S1, S2 normal, no murmur, click, rub or gallop GI: abnormal findings:  distended, guarding, hypoactive bowel sounds and obese. Some rebound along flank today. No acute abdomen. Extremities: extremities normal, atraumatic, no cyanosis or edema and Homans sign is negative, no sign of DVT  Assessment: s/p Procedure(s): laparotomy with staging  and lysis of adhesions (N/A) OOPHORECTOMY Bilateral (Bilateral) EXAM UNDER ANESTHESIA (N/A) CYSTOSCOPY with possible stent placement (Bilateral) OMENTECTOMY CYSTOSCOPY WITH STENT REMOVAL (Bilateral): stable, not tolerating diet, ileus present and anemia  Plan: Presumed postoperative ileus: NPO, reglan Pain: Continue to wean PCA. Continue PCA until ileus resolved. Plan for po meds with iv rescue. PPx: Continue to encourage ambulation. SCDs while in bed. Hold Lovenox while concerns about bleeding. Pepcid for acid reflux. Encourage deep breathing and incentive spirometry.  Acute blood loss anemia - if Hgb continues to drop, will consider repeat CT with contrast and interventional radiology for continued bleeding. Undiagnosed likely diabetes: blood sugar wnl today.   LOS: 3 days    Vanessa Caldwell 12/22/2014, 11:37 AM

## 2014-12-23 ENCOUNTER — Encounter: Payer: Self-pay | Admitting: Obstetrics and Gynecology

## 2014-12-23 LAB — CBC
HEMATOCRIT: 29.1 % — AB (ref 35.0–47.0)
Hemoglobin: 9.7 g/dL — ABNORMAL LOW (ref 12.0–16.0)
MCH: 31.4 pg (ref 26.0–34.0)
MCHC: 33.3 g/dL (ref 32.0–36.0)
MCV: 94.2 fL (ref 80.0–100.0)
Platelets: 170 10*3/uL (ref 150–440)
RBC: 3.09 MIL/uL — ABNORMAL LOW (ref 3.80–5.20)
RDW: 12.8 % (ref 11.5–14.5)
WBC: 5.6 10*3/uL (ref 3.6–11.0)

## 2014-12-23 LAB — BASIC METABOLIC PANEL
Anion gap: 7 (ref 5–15)
BUN: 5 mg/dL — AB (ref 6–20)
CHLORIDE: 99 mmol/L — AB (ref 101–111)
CO2: 32 mmol/L (ref 22–32)
Calcium: 8.4 mg/dL — ABNORMAL LOW (ref 8.9–10.3)
Creatinine, Ser: 0.8 mg/dL (ref 0.44–1.00)
GFR calc Af Amer: >60 mL/min (ref >60)
GLUCOSE: 97 mg/dL (ref 65–99)
POTASSIUM: 3.2 mmol/L — AB (ref 3.5–5.1)
Sodium: 138 mmol/L (ref 135–145)

## 2014-12-23 LAB — INHIBIN B: Inhibin B: <7 pg/mL

## 2014-12-23 MED ORDER — OXYCODONE-ACETAMINOPHEN 7.5-325 MG PO TABS: 1.0000 | ORAL_TABLET | ORAL | Status: DC | PRN

## 2014-12-23 MED ORDER — OXYCODONE-ACETAMINOPHEN 7.5-325 MG PO TABS
2.0000 | ORAL_TABLET | ORAL | Status: DC | PRN
Start: 2014-12-23 — End: 2014-12-24
  Administered 2014-12-23: 2 via ORAL
  Filled 2014-12-23: qty 2

## 2014-12-23 MED ORDER — HYDROMORPHONE HCL 1 MG/ML IJ SOLN
1.0000 mg | INTRAMUSCULAR | Status: DC | PRN
  Administered 2014-12-23: 1 mg via INTRAVENOUS
  Administered 2014-12-23 – 2014-12-24 (×3): 2 mg via INTRAVENOUS
  Filled 2014-12-23 (×3): qty 2
  Filled 2014-12-23: qty 1

## 2014-12-23 NOTE — Progress Notes (Signed)
4 Days Post-Op Procedure(s) (LRB): laparotomy with staging  and lysis of adhesions (N/A) OOPHORECTOMY Bilateral (Bilateral) EXAM UNDER ANESTHESIA (N/A) CYSTOSCOPY with possible stent placement (Bilateral) OMENTECTOMY CYSTOSCOPY WITH STENT REMOVAL (Bilateral)  Subjective: Patient reports incisional pain, tolerating PO, + flatus and no problems voiding.    Objective: I have reviewed patient's vital signs, intake and output, medications and labs.  General: alert, cooperative and no distress Resp: clear to auscultation bilaterally Cardio: regular rate and rhythm, S1, S2 normal, no murmur, click, rub or gallop GI: soft, non-tender; bowel sounds normal; no masses,  no organomegaly and incision: clean, dry, intact and mildy tender to palpation Extremities: extremities normal, atraumatic, no cyanosis or edema and Homans sign is negative, no sign of DVT  Assessment: s/p Procedure(s): laparotomy with staging  and lysis of adhesions (N/A) OOPHORECTOMY Bilateral (Bilateral) EXAM UNDER ANESTHESIA (N/A) CYSTOSCOPY with possible stent placement (Bilateral) OMENTECTOMY CYSTOSCOPY WITH STENT REMOVAL (Bilateral): stable, progressing well and ilieus resolving  Plan: Advance diet Encourage ambulation Advance to PO medication discontinue PCA  LOS: 4 days    Kammy Klett 12/23/2014, 12:46 PM

## 2014-12-24 MED ORDER — HYDROMORPHONE HCL 2 MG PO TABS
2.0000 mg | ORAL_TABLET | ORAL | Status: DC | PRN
  Administered 2014-12-24 – 2014-12-25 (×4): 2 mg via ORAL
  Filled 2014-12-24 (×4): qty 1

## 2014-12-24 NOTE — Progress Notes (Signed)
5 Days Post-Op Procedure(s) (LRB): laparotomy with staging  and lysis of adhesions (N/A) OOPHORECTOMY Bilateral (Bilateral) EXAM UNDER ANESTHESIA (N/A) CYSTOSCOPY with possible stent placement (Bilateral) OMENTECTOMY CYSTOSCOPY WITH STENT REMOVAL (Bilateral)  Subjective: Patient reports + flatus, + BM and no problems voiding.    Objective: I have reviewed patient's vital signs, intake and output, medications and labs.  General: alert, cooperative and no distress Resp: clear to auscultation bilaterally Cardio: regular rate and rhythm, S1, S2 normal, no murmur, click, rub or gallop GI: soft, non-tender; bowel sounds normal; no masses,  no organomegaly and incision: clean, dry, intact and erythema in skin underpannus, no fluctuance, appears not infected Extremities: extremities normal, atraumatic, no cyanosis or edema and no edema, redness or tenderness in the calves or thighs mood stable  Assessment: s/p Procedure(s): laparotomy with staging  and lysis of adhesions (N/A) OOPHORECTOMY Bilateral (Bilateral) EXAM UNDER ANESTHESIA (N/A) CYSTOSCOPY with possible stent placement (Bilateral) OMENTECTOMY CYSTOSCOPY WITH STENT REMOVAL (Bilateral): stable, progressing well, anemia and ileus resolved.   Plan: Advance diet Encourage ambulation Advance to PO medication d/c iv fluids when tolerating po  Pain meds titration will be the most important part of her care today. PO dilaudid as didn't tolerate po percocet (didn't help)  LOS: 5 days    Vanessa Caldwell 12/24/2014, 8:53 AM

## 2014-12-24 NOTE — Progress Notes (Signed)
Initial Nutrition Assessment  DOCUMENTATION CODES:     INTERVENTION:  Meals and Snacks: Cater to patient preferences   NUTRITION DIAGNOSIS:  Inadequate oral intake related to acute illness as evidenced by  (NPO/CL past 6 days ).  GOAL:  Patient will meet greater than or equal to 90% of their needs  MONITOR:   (Energy Intake, Digestive System, Anthropometrics)  REASON FOR ASSESSMENT:   (RD Screen, Length of Stay)    ASSESSMENT:  Pt s/p oophrectomy with LOA and cystoscopy with post-op ileus.   PMHx: Past Medical History  Diagnosis Date  . Asthma   . Cancer   . History of drug use     Remote hx crack (>89yrs ago)   PO Intake: pt NPO/CL for 6 days. Pt reports tolerating lunch very well today of broccoli and creamed potatoes. Pt reports appetite was healthy PTA intentionally trying to eat better with less kcals to lose weight.  Medications: Colace, L-R at 134mL/hr Labs: Electrolyte and Renal Profile:  Recent Labs Lab 12/21/14 0627 12/22/14 0605 12/23/14 0514  BUN 10 7 5*  CREATININE 0.85 0.82 0.80  NA 138 139 138  K 3.5 3.3* 3.2*   Glucose Profile: No results for input(s): GLUCAP in the last 72 hours.  Pt reports intentional weight loss PTA. Pt reports prior weight of 525lbs and has lost down to 293lbs PTA.  Height:  Ht Readings from Last 1 Encounters:  12/20/14 5\' 7"  (1.702 m)    Weight:  Wt Readings from Last 1 Encounters:  12/24/14 302 lb 3.2 oz (137.077 kg)    Ideal Body Weight:     Wt Readings from Last 10 Encounters:  12/24/14 302 lb 3.2 oz (137.077 kg)  11/13/12 310 lb (140.615 kg)    BMI:  Body mass index is 47.32 kg/(m^2).  Estimated Nutritional Needs:  Kcal:  1811-2140kcals, BEE: 1267kcals, TEE: (IF 1.1-1.3)(AF 1.3)using IBW of 61.4kg  Protein:  61-74g protein (1.0-1.2g/kg) using IBW of 61.4kg  Fluid:  1535-1812mL of fluid (25-63mL/kG) using IBw of 61.4kg  Skin:  Reviewed, no issues  Diet Order:  Diet regular Room service  appropriate?: Yes; Fluid consistency:: Thin  EDUCATION NEEDS:  Education needs no appropriate at this time   Intake/Output Summary (Last 24 hours) at 12/24/14 1358 Last data filed at 12/24/14 1300  Gross per 24 hour  Intake   2698 ml  Output   2400 ml  Net    298 ml    Last BM:  5/31  MODERATE Care Level  Dwyane Luo, RD, LDN Pager [phone]

## 2014-12-25 LAB — CYTOLOGY - NON PAP

## 2014-12-25 LAB — SURGICAL PATHOLOGY

## 2014-12-25 MED ORDER — HYDROMORPHONE HCL 2 MG PO TABS: 2.0000 mg | ORAL_TABLET | ORAL | Status: DC | PRN

## 2014-12-25 MED ORDER — DOCUSATE SODIUM 100 MG PO CAPS: 100.0000 mg | ORAL_CAPSULE | Freq: Two times a day (BID) | ORAL | Status: AC

## 2014-12-25 MED ORDER — ONDANSETRON HCL 4 MG PO TABS: 4.0000 mg | ORAL_TABLET | Freq: Four times a day (QID) | ORAL | Status: AC | PRN

## 2014-12-25 MED ORDER — IBUPROFEN 800 MG PO TABS: 800.0000 mg | ORAL_TABLET | Freq: Three times a day (TID) | ORAL | Status: AC | PRN

## 2014-12-25 NOTE — Discharge Summary (Signed)
Physician Discharge Summary  Patient ID: Vanessa Caldwell MRN: [id number] DOB/AGE: [birth year] 49 y.o.  Admit date: 12/18/2014 Discharge date: 12/25/2014  Admission Diagnoses:  Pain control, bilateral ovarian masses  Discharge Diagnoses:  Principal Problem:   Pelvic mass in female Active Problems:   Pain   Obesity, morbid, BMI 40.0-49.9   Pelvic mass   Postoperative state   Discharged Condition: good  Hospital Course: Pt admitted for pain control and underwent ex lap with bilateral oopherectomy, lysis of adhesions. Her postop course was complicated by inadequate pain control, acute blood loss anemia and mild postop ileus. Her PCA was slowly titrated off and on day of discharge her pain was controlled with po meds. IV hydration and NPO resolved her ileus. She did not require blood transfusion.   She was discharged in good condition.  Pathology not finalized by day of discharge. Intraop pathology was benign.  Consults: case management  Significant Diagnostic Studies: radiology: CT scan: bilateral pelvic masses on admission  Treatments: IV hydration and surgery: ex lap and bilateral oopherectomy with lysis of adhesions.  Discharge Exam: Blood pressure 145/63, pulse 65, temperature 97.9 F (36.6 C), temperature source Oral, resp. rate 19, height 5\' 7"  (1.702 m), weight 136.17 kg (300 lb 3.2 oz), SpO2 100 %. General appearance: alert, cooperative and no distress Head: Normocephalic, without obvious abnormality, atraumatic Back: symmetric, no curvature. ROM normal. No CVA tenderness. Resp: clear to auscultation bilaterally and normal percussion bilaterally Chest wall: no tenderness Breasts: normal appearance, no masses or tenderness Cardio: regular rate and rhythm, S1, S2 normal, no murmur, click, rub or gallop GI: soft, non-tender; bowel sounds normal; no masses,  no organomegaly Extremities: extremities normal, atraumatic, no cyanosis or edema and no edema, redness or tenderness  in the calves or thighs Pulses: 2+ and symmetric Skin: Skin color, texture, turgor normal. No rashes or lesions Neurologic: Grossly normal Incision/Wound: healing well, staples intact, no drainage or erythema  Disposition: 01-Home or Self Care  Discharge Instructions    Discontinue IV    Complete by:  As directed             Medication List    TAKE these medications        albuterol 1.25 MG/3ML nebulizer solution  Commonly known as:  ACCUNEB  Take 1 ampule by nebulization every 6 (six) hours as needed for wheezing.     beclomethasone 40 MCG/ACT inhaler  Commonly known as:  QVAR  Inhale 1 puff into the lungs 2 (two) times daily.     docusate sodium 100 MG capsule  Commonly known as:  COLACE  Take 1 capsule (100 mg total) by mouth 2 (two) times daily.     HYDROmorphone 2 MG tablet  Commonly known as:  DILAUDID  Take 1 tablet (2 mg total) by mouth every 3 (three) hours as needed for moderate pain or severe pain.     ibuprofen 800 MG tablet  Commonly known as:  ADVIL,MOTRIN  Take 1 tablet (800 mg total) by mouth every 8 (eight) hours as needed (mild pain).     ondansetron 4 MG tablet  Commonly known as:  ZOFRAN  Take 1 tablet (4 mg total) by mouth every 6 (six) hours as needed for nausea.     predniSONE 50 MG tablet  Commonly known as:  DELTASONE  1 tablet pO daily           Follow-up Information    Follow up with Benjaman Kindler, MD In 1 week.  Specialty:  Obstetrics and Gynecology   Why:  For staple removal   Contact information:   [place] [address] [phone]       Follow up with Benjaman Kindler, MD In 2 weeks.   Specialty:  Obstetrics and Gynecology   Why:  For wound re-check, For postop check   Contact information:   [place] [address] [phone]       Signed: Benjaman Kindler 12/25/2014, 1:40 PM

## 2014-12-25 NOTE — Care Management Note (Signed)
Case Management Note  Patient Details  Name: Vanessa Caldwell MRN: [id number] Date of Birth: [birth year]  Subjective/Objective:                   Spoke with patient at MD request. Patient is from home with husband and adult son. Husband drives to appointments. Patient stated that she is independent and uses no DME. She is seen at [facility] by Dr Clide Deutscher.  Patient stated that she can afford medications as long as they are generic. Stated that she is well taken care of by her family. No CM needs identified .  Action/Plan:   Expected Discharge Date:                  Expected Discharge Plan:  Home/Self Care  In-House Referral:  NA  Discharge planning Services  CM Consult  Post Acute Care Choice:    Choice offered to:  NA  DME Arranged:    DME Agency:     HH Arranged:  NA HH Agency:     Status of Service:     Medicare Important Message Given:    Date Medicare IM Given:    Medicare IM give by:    Date Additional Medicare IM Given:    Additional Medicare Important Message give by:     If discussed at [place] of Stay Meetings, dates discussed:    Additional Comments:  Alvie Heidelberg, RN 12/25/2014, 4:40 PM

## 2014-12-25 NOTE — Discharge Instructions (Signed)
Laparotomy °Care After °Refer to this sheet in the next few weeks. These instructions provide you with information on caring for yourself after your procedure. Your caregiver may also give you more specific instructions. Your treatment has been planned according to current medical practices, but problems sometimes occur. Call your caregiver if you have any problems or questions after your procedure. °HOME CARE INSTRUCTIONS °ACTIVITY °· Rest as much as possible the first two weeks at home. °· Avoid strenuous activity such as heavy lifting (more than 10 pounds), pushing, or pulling. Limit stair climbing to once or twice a day for the first week, then slowly increase this activity. °· Take frequent rest periods throughout the day. °· Talk with your caregiver about when you may resume your usual physical activity. °· You need to be out of bed and walking as much as possible. This decreases the chance of: °¨ Blood clots. °¨ Pneumonia. °NUTRITION °· You can resume your normal diet once you regain bowel function. °· Drink plenty of fluids (6-8 glasses a day or as instructed by your caregiver). °· Eat a well-balanced diet. °· Daily portions of food from the meat (protein), milk, vegetable, and bread groups are necessary for your health. °ELIMINATION °It is very important not to strain during bowel movements. If constipation should occur, you may: °· Take a mild laxative. °· Add fruit and bran to your diet. °· Drink more fluids. °HYGIENE °· Take showers, not baths, until 4-6 weeks after surgery. °· If your incision is closed, you may take a shower or tub bath. °FEVER °If you feel feverish or have shaking chills, take your temperature. If it is 102° F (38.9° C), call your caregiver. The fever may mean there is an infection. °PAIN CONTROL °· Mild discomfort may occur. °· Only take over-the-counter or prescription medicines for pain, discomfort, or fever as directed by your caregiver. Take any prescribed medicines exactly as  directed. °INCISION CARE °· Keep your incision site clean with soap and water. °· Do not use a dressing unless your cut (incision) from surgery is draining or irritated. °· If you have small adhesive strips in place and they do not fall off within 10 days, carefully peel them off. °· Check your incision and surrounding area daily for any redness, swelling, discoloration, heavy drainage, or separation of the skin. °SEXUAL INTERCOURSE °Do not have sexual intercourse until after your follow-up appointment, unless your caregiver tells you otherwise. °SEEK MEDICAL CARE IF:  °· You are unable to tolerate food or drinks. °· You are unable to pass gas or have a bowel movement. °· Your pain becomes more severe or is not relieved with medicines. °· You have redness, swelling, discoloration, heavy drainage, or separation of the skin at the incision site. °Document Released: 02/23/2004 Document Revised: 06/27/2012 Document Reviewed: 07/10/2007 °ExitCare® Patient Information ©2015 [employer]. This information is not intended to replace advice given to you by your health care provider. Make sure you discuss any questions you have with your health care provider. ° °

## 2014-12-25 NOTE — Progress Notes (Signed)
Pt d/c home; d/c instructions reviewed w/ pt; pt understanding was verbalized; IV removed catheter in tact, gauze dressing applied; all pt questions answered; pt left unit via wheelchair accompanied by staff 

## 2015-04-07 IMAGING — CR DG FOOT COMPLETE 3+V*L*
1 series · 3 of 3 positions shown · non-contrast
Comparison: None.

CLINICAL DATA: Pain and infection to left ankle and foot.

EXAM:
LEFT FOOT - COMPLETE 3+ VIEW

[Series 1: ap · 0.17mm/px · 3 of 3 slices shown]
[im 1/3]
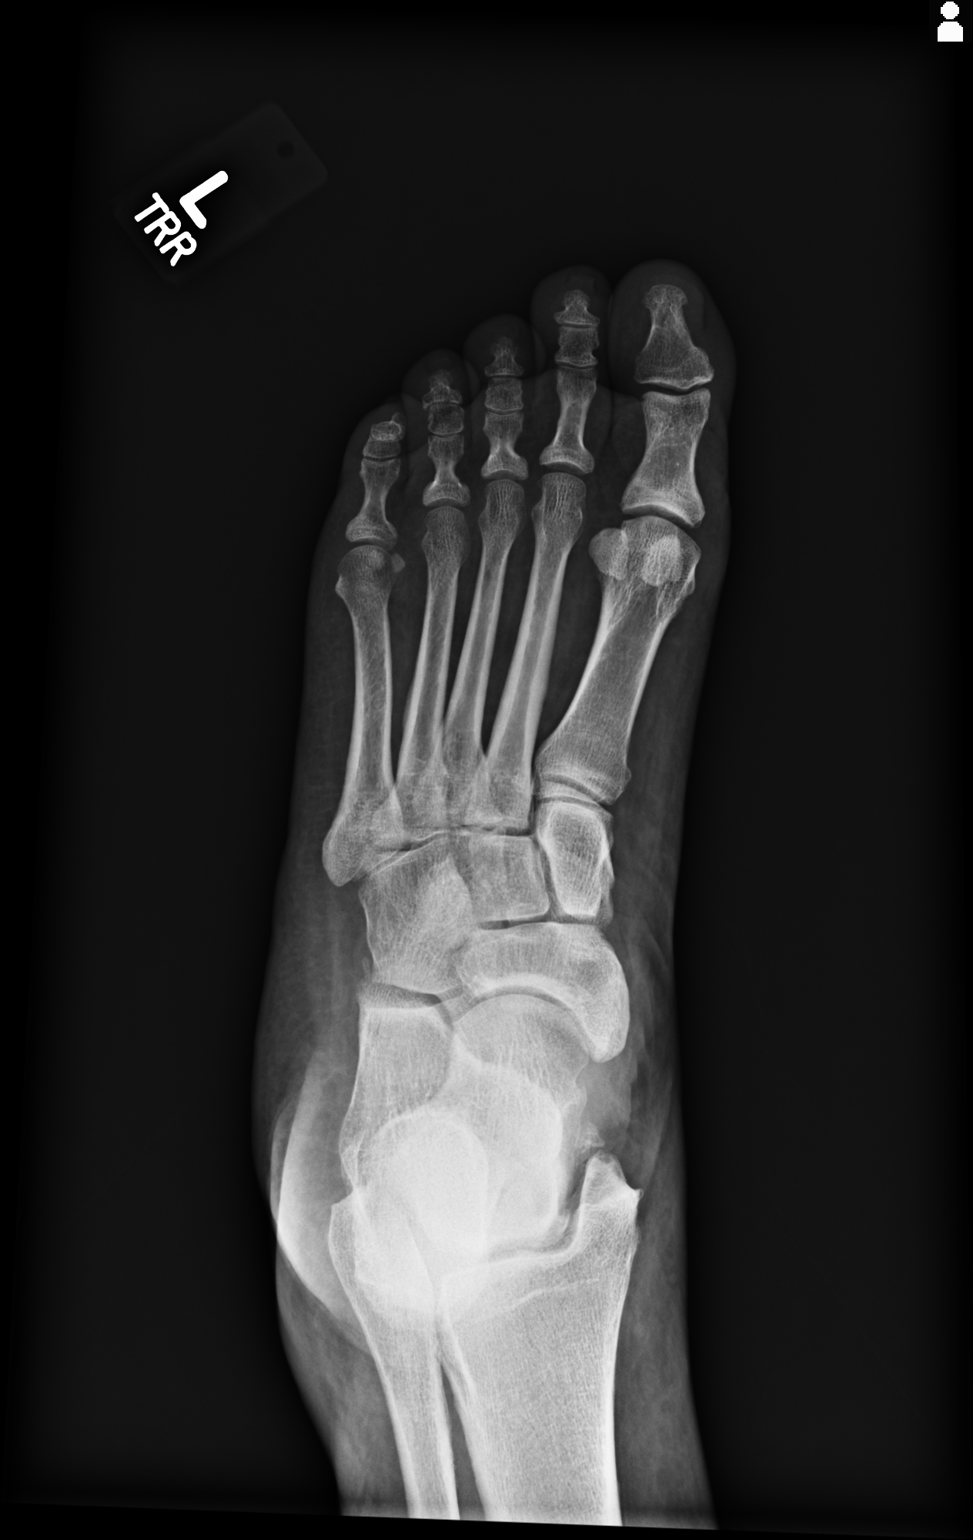
[im 2/3]
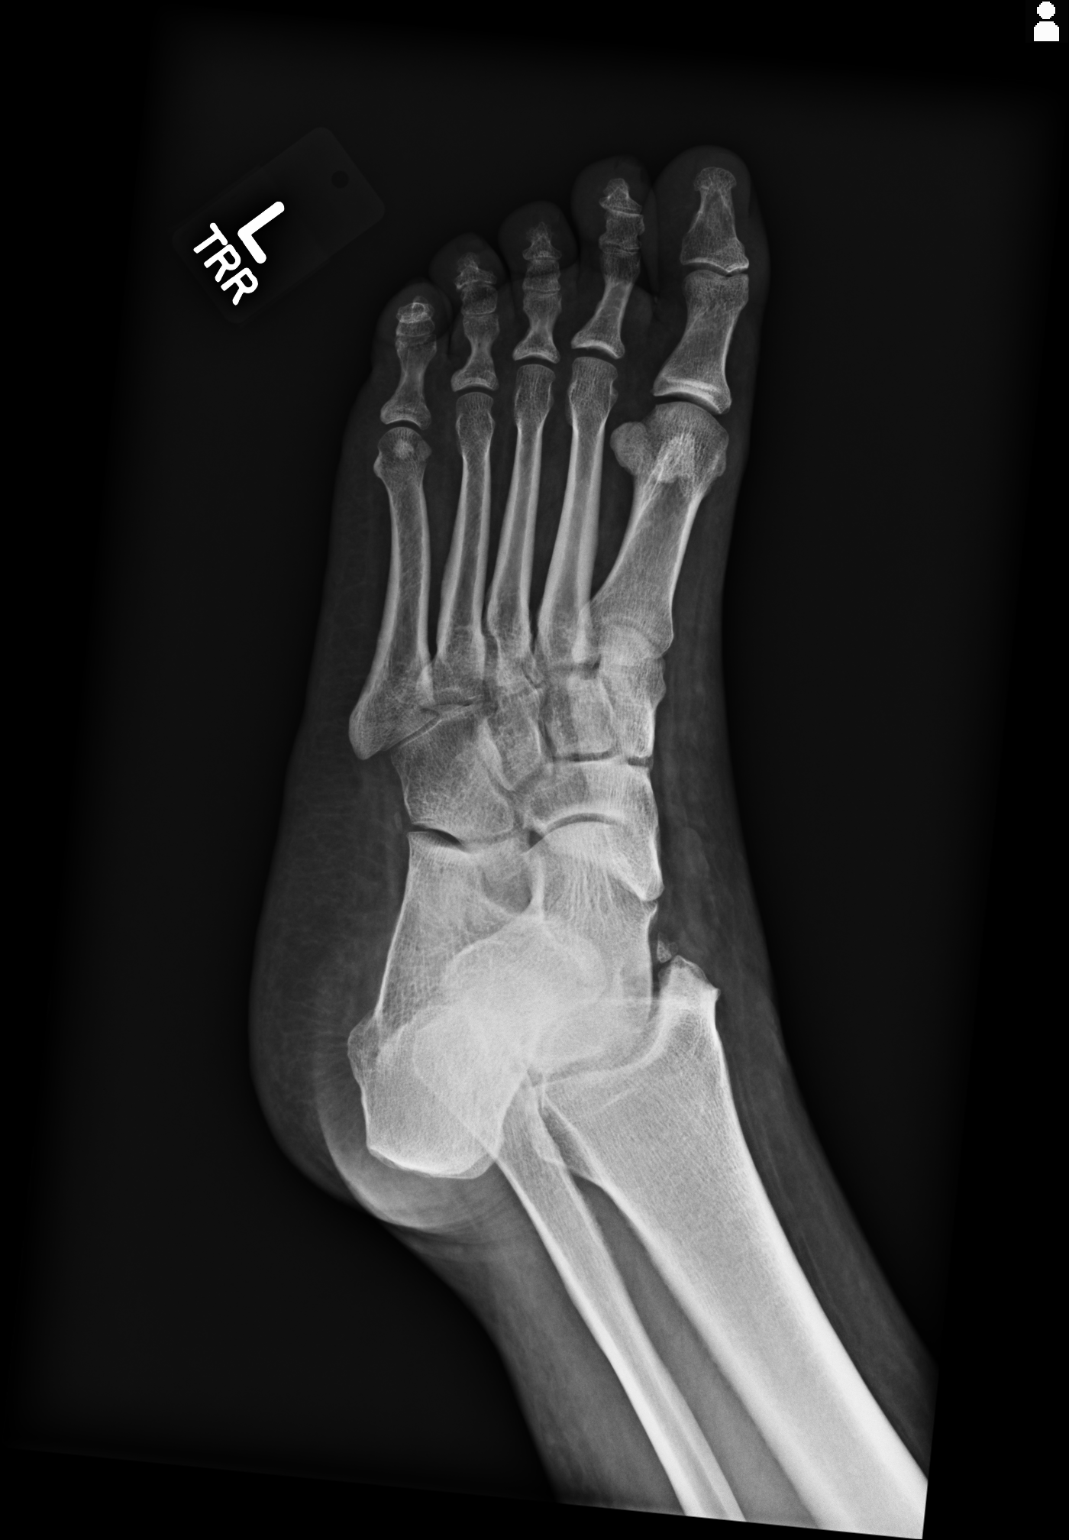
[im 3/3]
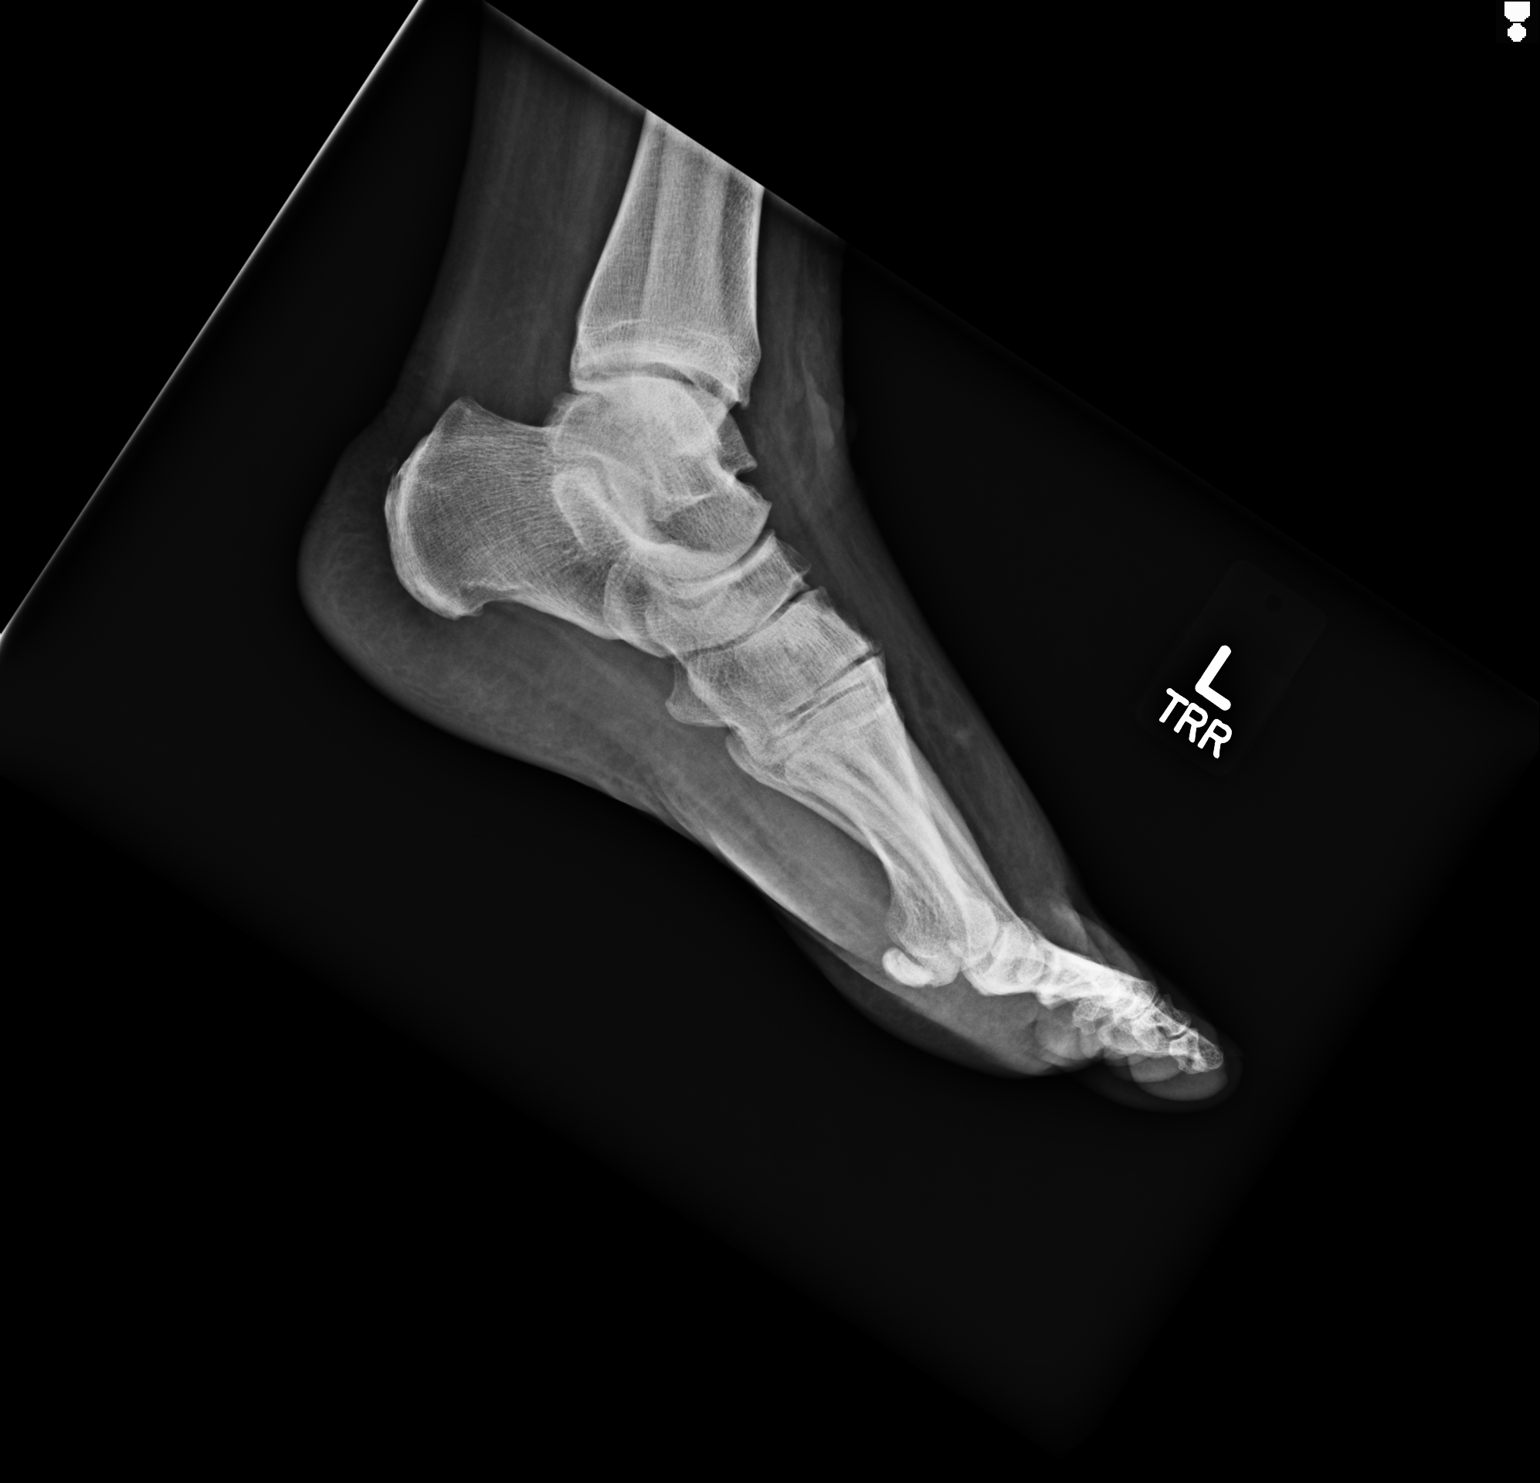

[3 of 3 positions shown; findings below may reference images not displayed]

FINDINGS: There is no evidence of fracture or dislocation. There is no
evidence of arthropathy or other focal bone abnormality. Soft
tissues are unremarkable.
IMPRESSION: Negative.

## 2015-11-24 ENCOUNTER — Encounter: Payer: Self-pay | Admitting: Emergency Medicine

## 2015-11-24 ENCOUNTER — Emergency Department: Payer: BLUE CROSS/BLUE SHIELD

## 2015-11-24 ENCOUNTER — Emergency Department
Admission: EM | Admit: 2015-11-24 | Discharge: 2015-11-24 | Disposition: A | Discharge status: Home / Self Care | Payer: BLUE CROSS/BLUE SHIELD | Attending: Emergency Medicine | Admitting: Emergency Medicine

## 2015-11-24 DIAGNOSIS — Z859 Personal history of malignant neoplasm, unspecified: Secondary | ICD-10-CM | POA: Insufficient documentation

## 2015-11-24 DIAGNOSIS — Z91040 Latex allergy status: Secondary | ICD-10-CM | POA: Insufficient documentation

## 2015-11-24 DIAGNOSIS — Z79899 Other long term (current) drug therapy: Secondary | ICD-10-CM | POA: Insufficient documentation

## 2015-11-24 DIAGNOSIS — E669 Obesity, unspecified: Secondary | ICD-10-CM | POA: Insufficient documentation

## 2015-11-24 DIAGNOSIS — J40 Bronchitis, not specified as acute or chronic: Secondary | ICD-10-CM

## 2015-11-24 DIAGNOSIS — F17210 Nicotine dependence, cigarettes, uncomplicated: Secondary | ICD-10-CM | POA: Diagnosis not present

## 2015-11-24 DIAGNOSIS — R0602 Shortness of breath: Secondary | ICD-10-CM | POA: Diagnosis present

## 2015-11-24 DIAGNOSIS — F1290 Cannabis use, unspecified, uncomplicated: Secondary | ICD-10-CM | POA: Diagnosis not present

## 2015-11-24 DIAGNOSIS — Z7952 Long term (current) use of systemic steroids: Secondary | ICD-10-CM | POA: Insufficient documentation

## 2015-11-24 DIAGNOSIS — J45909 Unspecified asthma, uncomplicated: Secondary | ICD-10-CM | POA: Insufficient documentation

## 2015-11-24 DIAGNOSIS — J45901 Unspecified asthma with (acute) exacerbation: Secondary | ICD-10-CM

## 2015-11-24 LAB — CBC
HCT: 38.9 % (ref 35.0–47.0)
Hemoglobin: 13.5 g/dL (ref 12.0–16.0)
MCH: 31.6 pg (ref 26.0–34.0)
MCHC: 34.6 g/dL (ref 32.0–36.0)
MCV: 91.2 fL (ref 80.0–100.0)
Platelets: 203 10*3/uL (ref 150–440)
RBC: 4.26 MIL/uL (ref 3.80–5.20)
RDW: 13 % (ref 11.5–14.5)
WBC: 7.7 10*3/uL (ref 3.6–11.0)

## 2015-11-24 LAB — BASIC METABOLIC PANEL
Anion gap: 8 (ref 5–15)
BUN: 15 mg/dL (ref 6–20)
CO2: 24 mmol/L (ref 22–32)
Calcium: 9.4 mg/dL (ref 8.9–10.3)
Chloride: 107 mmol/L (ref 101–111)
Creatinine, Ser: 0.93 mg/dL (ref 0.44–1.00)
GFR calc Af Amer: >60 mL/min (ref >60)
GFR calc non Af Amer: >60 mL/min (ref >60)
Glucose, Bld: 102 mg/dL — ABNORMAL HIGH (ref 65–99)
POTASSIUM: 3.8 mmol/L (ref 3.5–5.1)
Sodium: 139 mmol/L (ref 135–145)

## 2015-11-24 LAB — TROPONIN I: Troponin I: <0.03 ng/mL (ref <0.031)

## 2015-11-24 MED ORDER — AZITHROMYCIN 250 MG PO TABS: ORAL_TABLET | ORAL | Status: AC

## 2015-11-24 MED ORDER — IPRATROPIUM-ALBUTEROL 0.5-2.5 (3) MG/3ML IN SOLN
RESPIRATORY_TRACT | Status: AC
  Filled 2015-11-24: qty 3

## 2015-11-24 MED ORDER — METHYLPREDNISOLONE SODIUM SUCC 125 MG IJ SOLR
125.0000 mg | Freq: Once | INTRAMUSCULAR | Status: AC
  Administered 2015-11-24: 125 mg via INTRAVENOUS
  Filled 2015-11-24: qty 2

## 2015-11-24 MED ORDER — IPRATROPIUM-ALBUTEROL 0.5-2.5 (3) MG/3ML IN SOLN
3.0000 mL | Freq: Once | RESPIRATORY_TRACT | Status: AC
  Administered 2015-11-24: 3 mL via RESPIRATORY_TRACT

## 2015-11-24 NOTE — ED Notes (Signed)
Pt assisted to restroom.  

## 2015-11-24 NOTE — ED Notes (Signed)
Pt. Verbalizes understanding of d/c instructions, follow-up care, and prescriptions.  Pt. displays no s/s of distress at this time. Pt. Verbalizes no concerns at this time. VS stable. Pt. out of the unit by wheelchair with RN.

## 2015-11-24 NOTE — ED Notes (Signed)
Arrived via EMS a&o.  C/o sob.  Seen at [facility] yesterday and started on prednisone and antibiotic but has not been able to get them filled, this am "feels like there is a band around my lungs". Paper from [facility] states pt is + for h pylori

## 2015-11-24 NOTE — ED Notes (Signed)
Pt informed to return if any life threatening symptoms occur.  

## 2015-11-24 NOTE — ED Provider Notes (Addendum)
[facility] Emergency Department Provider Note  ____________________________________________   I have reviewed the triage vital signs and the nursing notes.   HISTORY  Chief Complaint Shortness of Breath    HPI Vanessa Caldwell is a 50 y.o. female complains of cough and wheeze. She does have a history ofasthma and COPD she states. She has not had any chest pain she states she's been coughing and wheezing. Patient states she went to a outpatient clinic and was given steroids for this but did not take any, he states he didn't fill the prescription but she did not take the medications. The patient denies any leg swelling or orthopnea or exertional chest pain or shortness of breath. She just has a cough and a wheeze. Patient has had no fever, no chills no nausea no vomiting no diarrhea, he states she has multiple seasonal and environmental allergies which could be making this worse. She states she does usually get much better when she has these flares of her lung disease after steroids.   Past Medical History  Diagnosis Date  . Asthma   . Cancer ([place])   . History of drug use ([place])     Remote hx crack (>4yrs ago)    Patient Active Problem List   Diagnosis Date Noted  . Postoperative state 12/19/2014  . Pelvic mass in female 12/18/2014  . Pain 12/18/2014  . Obesity, morbid, BMI 40.0-49.9 ([place]) 12/18/2014  . Pelvic mass 12/18/2014  . Mass of pelvis 12/06/2014    Past Surgical History  Procedure Laterality Date  . Cesarean section    . Abdominal hysterectomy    . Cesarean hysterectomy    . Open bilateral hysterectomy    . Laparotomy with staging N/A 12/19/2014    Procedure: laparotomy with staging  and lysis of adhesions;  Surgeon: Benjaman Kindler, MD;  Location: [facility] ORS;  Service: Gynecology;  Laterality: N/A;  . Oophorectomy Bilateral 12/19/2014    Procedure: OOPHORECTOMY Bilateral;  Surgeon: Benjaman Kindler, MD;  Location: [facility] ORS;  Service: Gynecology;   Laterality: Bilateral;  . Examination under anesthesia N/A 12/19/2014    Procedure: EXAM UNDER ANESTHESIA;  Surgeon: Benjaman Kindler, MD;  Location: [facility] ORS;  Service: Gynecology;  Laterality: N/A;  . Cystoscopy Bilateral 12/19/2014    Procedure: CYSTOSCOPY with possible stent placement;  Surgeon: Benjaman Kindler, MD;  Location: [facility] ORS;  Service: Gynecology;  Laterality: Bilateral;  . Omentectomy  12/19/2014    Procedure: OMENTECTOMY;  Surgeon: Benjaman Kindler, MD;  Location: [facility] ORS;  Service: Gynecology;;  . Cystoscopy w/ ureteral stent removal Bilateral 12/19/2014    Procedure: CYSTOSCOPY WITH STENT REMOVAL;  Surgeon: Benjaman Kindler, MD;  Location: [facility] ORS;  Service: Gynecology;  Laterality: Bilateral;    Current Outpatient Rx  Name  Route  Sig  Dispense  Refill  . albuterol (ACCUNEB) 1.25 MG/3ML nebulizer solution   Nebulization   Take 1 ampule by nebulization every 6 (six) hours as needed for wheezing.         . beclomethasone (QVAR) 40 MCG/ACT inhaler   Inhalation   Inhale 1 puff into the lungs 2 (two) times daily.         Marland Kitchen docusate sodium (COLACE) 100 MG capsule   Oral   Take 1 capsule (100 mg total) by mouth 2 (two) times daily.   30 capsule   2   . HYDROmorphone (DILAUDID) 2 MG tablet   Oral   Take 1 tablet (2 mg total) by mouth every 3 (three) hours  as needed for moderate pain or severe pain.   30 tablet   0   . ibuprofen (ADVIL,MOTRIN) 800 MG tablet   Oral   Take 1 tablet (800 mg total) by mouth every 8 (eight) hours as needed (mild pain).   30 tablet   0   . ondansetron (ZOFRAN) 4 MG tablet   Oral   Take 1 tablet (4 mg total) by mouth every 6 (six) hours as needed for nausea.   20 tablet   0   . predniSONE (DELTASONE) 50 MG tablet      1 tablet pO daily   5 tablet   0     Allergies Peanuts; Nutmeg oil (myristica oil); Bee venom; Latex; Other; Penicillin g; and Penicillins  No family history on file.  Social History Social History   Substance Use Topics  . Smoking status: Current Some Day Smoker -- 0.25 packs/day    Types: Cigarettes  . Smokeless tobacco: None  . Alcohol Use: No     Comment: smokes a joint whenever she feels nauseated    Review of Systems Constitutional: No fever/chills Eyes: No visual changes. ENT: No sore throat. No stiff neck no neck pain Cardiovascular: Denies chest pain. Respiratory: Positive shortness of breath. Gastrointestinal:   no vomiting.  No diarrhea.  No constipation. Genitourinary: Negative for dysuria. Musculoskeletal: Negative lower extremity swelling Skin: Negative for rash. Neurological: Negative for headaches, focal weakness or numbness. 10-point ROS otherwise negative.  ____________________________________________   PHYSICAL EXAM:  VITAL SIGNS: ED Triage Vitals  Enc Vitals Group     BP 11/24/15 0929 185/105 mmHg     Pulse Rate 11/24/15 0926 72     Resp 11/24/15 0926 22     Temp 11/24/15 0926 97.8 F (36.6 C)     Temp src --      SpO2 11/24/15 0926 96 %     Weight 11/24/15 0926 300 lb (136.079 kg)     Height 11/24/15 0926 5\' 7"  (1.702 m)     Head Cir --      Peak Flow --      Pain Score --      Pain Loc --      Pain Edu? --      Excl. in [place]? --     Constitutional: Alert and oriented. Well appearing and in no acute distress.Well appearing using her telephone Eyes: Conjunctivae are normal. PERRL. EOMI. Head: Atraumatic. Nose: No congestion/rhinnorhea. Mouth/Throat: Mucous membranes are moist.  Oropharynx non-erythematous. Neck: No stridor.   Nontender with no meningismus Cardiovascular: Normal rate, regular rhythm. Grossly normal heart sounds.  Good peripheral circulation. Respiratory: Normal respiratory effort.  No retractions. Occasional basilar wheezes no rales or rhonchi. Abdominal: Soft and nontender. No distention. No guarding no rebound Back:  There is no focal tenderness or step off there is no midline tenderness there are no lesions noted. there  is no CVA tenderness Musculoskeletal: No lower extremity tenderness. No joint effusions, no DVT signs strong distal pulses no edema Neurologic:  Normal speech and language. No gross focal neurologic deficits are appreciated.  Skin:  Skin is warm, dry and intact. No rash noted. Psychiatric: Mood and affect are normal. Speech and behavior are normal.  ____________________________________________   LABS (all labs ordered are listed, but only abnormal results are displayed)  Labs Reviewed  BASIC METABOLIC PANEL - Abnormal; Notable for the following:    Glucose, Bld 102 (*)    All other components within normal limits  CBC  TROPONIN I   ____________________________________________  EKG  I personally interpreted any EKGs ordered by me or triage Normal sinus rhythm rate 75 bpm no acute ST elevation or depression normal axis unremarkable EKG ____________________________________________  RADIOLOGY  I reviewed any imaging ordered by me or triage that were performed during my shift and, if possible, patient and/or family made aware of any abnormal findings. ____________________________________________   PROCEDURES  Procedure(s) performed: None  Critical Care performed: None  ____________________________________________   INITIAL IMPRESSION / ASSESSMENT AND PLAN / ED COURSE  Pertinent labs & imaging results that were available during my care of the patient were reviewed by me and considered in my medical decision making (see chart for details).  Patient with cough and wheeze with a history of asthma, very well-appearing, no accessory muscle use, no evidence of CHF, chest x-ray is negative, blood work is reassuring, no evidence of ACS, troponin is negative despite 2 weeks of symptoms. After albuterol and steroids here patient states she is better baseline, she is moving excellent air ambulating with no difficulty with good oxygen saturations, 98 on room air. She is requesting  discharge. We will advise that she takes the steroids and antibiotics that she was given yesterday and return to the clinic or emergency department for new or worrisome symptoms, Nothing to suggest PE ACS or dissection myocarditis endocarditis pericarditis intra-abdominal pathology pneumothorax pneumonia or any other acute life-threatening disease at this time ____________________________________________   FINAL CLINICAL IMPRESSION(S) / ED DIAGNOSES  Final diagnoses:  None      This chart was dictated using voice recognition software.  Despite best efforts to proofread,  errors can occur which can change meaning.     Schuyler Amor, MD 11/24/15 1159  Schuyler Amor, MD 11/24/15 [phone]

## 2015-11-24 NOTE — Discharge Instructions (Signed)
Take the steroids that were prescribed by her primary care doctor, return to the emergency room for increased shortness of breath, wheeze, persistent cough high fever chest pain or feel worse in any way.  Asthma, Adult Asthma is a condition of the lungs in which the airways tighten and narrow. Asthma can make it hard to breathe. Asthma cannot be cured, but medicine and lifestyle changes can help control it. Asthma may be started (triggered) by:  Animal skin flakes (dander).  Dust.  Cockroaches.  Pollen.  Mold.  Smoke.  Cleaning products.  Hair sprays or aerosol sprays.  Paint fumes or strong smells.  Cold air, weather changes, and winds.  Crying or laughing hard.  Stress.  Certain medicines or drugs.  Foods, such as dried fruit, potato chips, and sparkling grape juice.  Infections or conditions (colds, flu).  Exercise.  Certain medical conditions or diseases.  Exercise or tiring activities. HOME CARE   Take medicine as told by your doctor.  Use a peak flow meter as told by your doctor. A peak flow meter is a tool that measures how well the lungs are working.  Record and keep track of the peak flow meter's readings.  Understand and use the asthma action plan. An asthma action plan is a written plan for taking care of your asthma and treating your attacks.  To help prevent asthma attacks:  Do not smoke. Stay away from secondhand smoke.  Change your heating and air conditioning filter often.  Limit your use of fireplaces and wood stoves.  Get rid of pests (such as roaches and mice) and their droppings.  Throw away plants if you see mold on them.  Clean your floors. Dust regularly. Use cleaning products that do not smell.  Have someone vacuum when you are not home. Use a vacuum cleaner with a HEPA filter if possible.  Replace carpet with wood, tile, or vinyl flooring. Carpet can trap animal skin flakes and dust.  Use allergy-proof pillows, mattress  covers, and box spring covers.  Wash bed sheets and blankets every week in hot water and dry them in a dryer.  Use blankets that are made of polyester or cotton.  Clean bathrooms and kitchens with bleach. If possible, have someone repaint the walls in these rooms with mold-resistant paint. Keep out of the rooms that are being cleaned and painted.  Wash hands often. GET HELP IF:  You have make a whistling sound when breaking (wheeze), have shortness of breath, or have a cough even if taking medicine to prevent attacks.  The colored mucus you cough up (sputum) is thicker than usual.  The colored mucus you cough up changes from clear or white to yellow, green, gray, or bloody.  You have problems from the medicine you are taking such as:  A rash.  Itching.  Swelling.  Trouble breathing.  You need reliever medicines more than 2-3 times a week.  Your peak flow measurement is still at 50-79% of your personal best after following the action plan for 1 hour.  You have a fever. GET HELP RIGHT AWAY IF:   You seem to be worse and are not responding to medicine during an asthma attack.  You are short of breath even at rest.  You get short of breath when doing very little activity.  You have trouble eating, drinking, or talking.  You have chest pain.  You have a fast heartbeat.  Your lips or fingernails start to turn blue.  You are light-headed,  dizzy, or faint.  Your peak flow is less than 50% of your personal best.   This information is not intended to replace advice given to you by your health care provider. Make sure you discuss any questions you have with your health care provider.   Document Released: 12/28/2007 Document Revised: 04/01/2015 Document Reviewed: 02/07/2013 Elsevier Interactive Patient Education [employer].

## 2015-12-10 ENCOUNTER — Emergency Department: Payer: BLUE CROSS/BLUE SHIELD

## 2015-12-10 ENCOUNTER — Emergency Department
Admission: EM | Admit: 2015-12-10 | Discharge: 2015-12-10 | Disposition: A | Discharge status: Home / Self Care | Payer: BLUE CROSS/BLUE SHIELD | Attending: Emergency Medicine | Admitting: Emergency Medicine

## 2015-12-10 ENCOUNTER — Encounter: Payer: Self-pay | Admitting: Emergency Medicine

## 2015-12-10 DIAGNOSIS — R0600 Dyspnea, unspecified: Secondary | ICD-10-CM

## 2015-12-10 DIAGNOSIS — Z91040 Latex allergy status: Secondary | ICD-10-CM | POA: Diagnosis not present

## 2015-12-10 DIAGNOSIS — Z791 Long term (current) use of non-steroidal anti-inflammatories (NSAID): Secondary | ICD-10-CM | POA: Diagnosis not present

## 2015-12-10 DIAGNOSIS — J45909 Unspecified asthma, uncomplicated: Secondary | ICD-10-CM | POA: Diagnosis not present

## 2015-12-10 DIAGNOSIS — F1290 Cannabis use, unspecified, uncomplicated: Secondary | ICD-10-CM | POA: Diagnosis not present

## 2015-12-10 DIAGNOSIS — Z7952 Long term (current) use of systemic steroids: Secondary | ICD-10-CM | POA: Insufficient documentation

## 2015-12-10 DIAGNOSIS — F17210 Nicotine dependence, cigarettes, uncomplicated: Secondary | ICD-10-CM | POA: Insufficient documentation

## 2015-12-10 DIAGNOSIS — Z79899 Other long term (current) drug therapy: Secondary | ICD-10-CM | POA: Diagnosis not present

## 2015-12-10 DIAGNOSIS — Z91010 Allergy to peanuts: Secondary | ICD-10-CM | POA: Diagnosis not present

## 2015-12-10 DIAGNOSIS — Z859 Personal history of malignant neoplasm, unspecified: Secondary | ICD-10-CM | POA: Insufficient documentation

## 2015-12-10 LAB — CBC
HEMATOCRIT: 37.1 % (ref 35.0–47.0)
Hemoglobin: 12.6 g/dL (ref 12.0–16.0)
MCH: 31.6 pg (ref 26.0–34.0)
MCHC: 34 g/dL (ref 32.0–36.0)
MCV: 92.9 fL (ref 80.0–100.0)
Platelets: 182 10*3/uL (ref 150–440)
RBC: 3.99 MIL/uL (ref 3.80–5.20)
RDW: 12.6 % (ref 11.5–14.5)
WBC: 11.1 10*3/uL — ABNORMAL HIGH (ref 3.6–11.0)

## 2015-12-10 LAB — BASIC METABOLIC PANEL
Anion gap: 9 (ref 5–15)
BUN: 14 mg/dL (ref 6–20)
CO2: 23 mmol/L (ref 22–32)
Calcium: 9.4 mg/dL (ref 8.9–10.3)
Chloride: 101 mmol/L (ref 101–111)
Creatinine, Ser: 1.05 mg/dL — ABNORMAL HIGH (ref 0.44–1.00)
GFR calc non Af Amer: >60 mL/min (ref >60)
Glucose, Bld: 226 mg/dL — ABNORMAL HIGH (ref 65–99)
Potassium: 3.4 mmol/L — ABNORMAL LOW (ref 3.5–5.1)
SODIUM: 133 mmol/L — AB (ref 135–145)

## 2015-12-10 LAB — TROPONIN I: Troponin I: <0.03 ng/mL (ref <0.031)

## 2015-12-10 MED ORDER — IPRATROPIUM-ALBUTEROL 0.5-2.5 (3) MG/3ML IN SOLN
3.0000 mL | Freq: Once | RESPIRATORY_TRACT | Status: AC
  Administered 2015-12-10: 3 mL via RESPIRATORY_TRACT
  Filled 2015-12-10: qty 3

## 2015-12-10 MED ORDER — METHYLPREDNISOLONE SODIUM SUCC 125 MG IJ SOLR
125.0000 mg | Freq: Once | INTRAMUSCULAR | Status: AC
  Administered 2015-12-10: 125 mg via INTRAVENOUS
  Filled 2015-12-10: qty 2

## 2015-12-10 MED ORDER — HEPARIN SOD (PORK) LOCK FLUSH 10 UNIT/ML IV SOLN
INTRAVENOUS | Status: AC
  Filled 2015-12-10: qty 1

## 2015-12-10 NOTE — ED Notes (Signed)
Pt presents to ED with complaints of shortness of breath for one day. EMS reports 98-99% RA, 152/96, 104 HR, CBG 200, 12 lead unremarkable. EMS reports wheezing and administered duoneb x 1 and solu medrol 125 mg IV.

## 2015-12-10 NOTE — ED Provider Notes (Signed)
[facility] Emergency Department Provider Note  Time seen: 8:02 AM  I have reviewed the triage vital signs and the nursing notes.   HISTORY  Chief Complaint Shortness of Breath    HPI Vanessa Caldwell is a 50 y.o. female with a past medical history of asthma who presents to the emergency department with difficulty breathing. According to the patient since yesterday evening she has been having difficulty breathing and feeling short of breath. She states it feels like an asthma exacerbation. Patient takes Qvar at night, and pro-air as needed. Patient used her pro-air today without relief so she came to the emergency department for evaluation. Patient denies any chest pain. States her shortness of breath continued today and felt somewhat worse this morning so she came to the emergency department for evaluation. Describes her shortness breath currently as moderate.Patient does describe a cough since last night as well with occasional sputum production.     Past Medical History  Diagnosis Date  . Asthma   . Cancer ([place])   . History of drug use ([place])     Remote hx crack (>74yrs ago)    Patient Active Problem List   Diagnosis Date Noted  . Postoperative state 12/19/2014  . Pelvic mass in female 12/18/2014  . Pain 12/18/2014  . Obesity, morbid, BMI 40.0-49.9 ([place]) 12/18/2014  . Pelvic mass 12/18/2014  . Mass of pelvis 12/06/2014    Past Surgical History  Procedure Laterality Date  . Cesarean section    . Abdominal hysterectomy    . Cesarean hysterectomy    . Open bilateral hysterectomy    . Laparotomy with staging N/A 12/19/2014    Procedure: laparotomy with staging  and lysis of adhesions;  Surgeon: Benjaman Kindler, MD;  Location: [facility] ORS;  Service: Gynecology;  Laterality: N/A;  . Oophorectomy Bilateral 12/19/2014    Procedure: OOPHORECTOMY Bilateral;  Surgeon: Benjaman Kindler, MD;  Location: [facility] ORS;  Service: Gynecology;  Laterality: Bilateral;  .  Examination under anesthesia N/A 12/19/2014    Procedure: EXAM UNDER ANESTHESIA;  Surgeon: Benjaman Kindler, MD;  Location: [facility] ORS;  Service: Gynecology;  Laterality: N/A;  . Cystoscopy Bilateral 12/19/2014    Procedure: CYSTOSCOPY with possible stent placement;  Surgeon: Benjaman Kindler, MD;  Location: [facility] ORS;  Service: Gynecology;  Laterality: Bilateral;  . Omentectomy  12/19/2014    Procedure: OMENTECTOMY;  Surgeon: Benjaman Kindler, MD;  Location: [facility] ORS;  Service: Gynecology;;  . Cystoscopy w/ ureteral stent removal Bilateral 12/19/2014    Procedure: CYSTOSCOPY WITH STENT REMOVAL;  Surgeon: Benjaman Kindler, MD;  Location: [facility] OR[facility];  Service: Gynecology;  Laterality: Bilateral;    Current Outpatient Rx  Name  Route  Sig  Dispense  Refill  . albuterol (ACCUNEB) 1.25 MG/3ML nebulizer solution   Nebulization   Take 1 ampule by nebulization every 6 (six) hours as needed for wheezing.         . beclomethasone (QVAR) 40 MCG/ACT inhaler   Inhalation   Inhale 1 puff into the lungs 2 (two) times daily.         Marland Kitchen docusate sodium (COLACE) 100 MG capsule   Oral   Take 1 capsule (100 mg total) by mouth 2 (two) times daily.   30 capsule   2   . HYDROmorphone (DILAUDID) 2 MG tablet   Oral   Take 1 tablet (2 mg total) by mouth every 3 (three) hours as needed for moderate pain or severe pain.   30 tablet   0   .  ibuprofen (ADVIL,MOTRIN) 800 MG tablet   Oral   Take 1 tablet (800 mg total) by mouth every 8 (eight) hours as needed (mild pain).   30 tablet   0   . ondansetron (ZOFRAN) 4 MG tablet   Oral   Take 1 tablet (4 mg total) by mouth every 6 (six) hours as needed for nausea.   20 tablet   0   . predniSONE (DELTASONE) 50 MG tablet      1 tablet pO daily   5 tablet   0     Allergies Peanuts; Nutmeg oil (myristica oil); Bee venom; Latex; Other; Penicillin g; and Penicillins  No family history on file.  Social History Social History  Substance Use Topics  . Smoking  status: Current Some Day Smoker -- 0.25 packs/day    Types: Cigarettes  . Smokeless tobacco: None  . Alcohol Use: No     Comment: smokes a joint whenever she feels nauseated    Review of Systems Constitutional: Negative for fever. Cardiovascular: Negative for chest pain. Respiratory: Positive for shortness of breath positive for cough with sputum production. Gastrointestinal: Negative for abdominal pain Musculoskeletal: Negative for back pain. Neurological: Negative for headache 10-point ROS otherwise negative.  ____________________________________________   PHYSICAL EXAM:  VITAL SIGNS: ED Triage Vitals  Enc Vitals Group     BP 12/10/15 0801 149/92 mmHg     Pulse Rate 12/10/15 0801 95     Resp 12/10/15 0801 18     Temp --      Temp src --      SpO2 12/10/15 0801 96 %     Weight 12/10/15 0801 300 lb (136.079 kg)     Height 12/10/15 0801 5\' 7"  (1.702 m)     Head Cir --      Peak Flow --      Pain Score 12/10/15 0802 0     Pain Loc --      Pain Edu? --      Excl. in [place]? --     Constitutional: Alert and oriented. Well appearing and in no distress. Eyes: Normal exam ENT   Head: Normocephalic and atraumatic.   Mouth/Throat: Mucous membranes are moist. Cardiovascular: Normal rate, regular rhythm. No murmur Respiratory: Normal respiratory effort without tachypnea nor retractions. Breath sounds are clear Gastrointestinal: Soft and nontender. No distention.   Musculoskeletal: Nontender with normal range of motion in all extremities. No lower extremity tenderness or edema. Neurologic:  Normal speech and language. No gross focal neurologic deficits Skin:  Skin is warm, dry and intact.  Psychiatric: Mood and affect are normal.   ____________________________________________    EKG  EKG reviewed and interpreted by myself shows normal sinus rhythm at 94 bpm, narrow QRS, normal axis, normal intervals, nonspecific but no concerning ST  changes.  ____________________________________________    RADIOLOGY  Chest x-ray shows no acute abnormality  ____________________________________________    INITIAL IMPRESSION / ASSESSMENT AND PLAN / ED COURSE  Pertinent labs & imaging results that were available during my care of the patient were reviewed by me and considered in my medical decision making (see chart for details).  The patient presents to the emergency department today for shortness of breath since yesterday evening. Currently the patient appears well, states she feels short of breath, but has a 96-98 percent room air oxygen saturation, clear lung sounds with good air movement bilaterally. We will check labs, chest x-ray, EKG, treat with a DuoNeb for symptom resolution, and closely monitor in  the emergency department.  Chest x-ray negative. Labs are within normal limits. Troponin negative. Patient feeling better. Continues to have a 96-98 percent room air saturation. Appears very well on examination. Patient states she had a prescription for steroids which she filled and began taking last night. Patient will follow up with her primary care physician.  ____________________________________________   FINAL CLINICAL IMPRESSION(S) / ED DIAGNOSES  Dyspnea   Harvest Dark, MD 12/10/15 [phone]

## 2015-12-10 NOTE — Discharge Instructions (Signed)
Asthma, Adult Asthma is a recurring condition in which the airways tighten and narrow. Asthma can make it difficult to breathe. It can cause coughing, wheezing, and shortness of breath. Asthma episodes, also called asthma attacks, range from minor to life-threatening. Asthma cannot be cured, but medicines and lifestyle changes can help control it. CAUSES Asthma is believed to be caused by inherited (genetic) and environmental factors, but its exact cause is unknown. Asthma may be triggered by allergens, lung infections, or irritants in the air. Asthma triggers are different for each person. Common triggers include:   Animal dander.  Dust mites.  Cockroaches.  Pollen from trees or grass.  Mold.  Smoke.  Air pollutants such as dust, household cleaners, hair sprays, aerosol sprays, paint fumes, strong chemicals, or strong odors.  Cold air, weather changes, and winds (which increase molds and pollens in the air).  Strong emotional expressions such as crying or laughing hard.  Stress.  Certain medicines (such as aspirin) or types of drugs (such as beta-blockers).  Sulfites in foods and drinks. Foods and drinks that may contain sulfites include dried fruit, potato chips, and sparkling grape juice.  Infections or inflammatory conditions such as the flu, a cold, or an inflammation of the nasal membranes (rhinitis).  Gastroesophageal reflux disease (GERD).  Exercise or strenuous activity. SYMPTOMS Symptoms may occur immediately after asthma is triggered or many hours later. Symptoms include:  Wheezing.  Excessive nighttime or early morning coughing.  Frequent or severe coughing with a common cold.  Chest tightness.  Shortness of breath. DIAGNOSIS  The diagnosis of asthma is made by a review of your medical history and a physical exam. Tests may also be performed. These may include:  Lung function studies. These tests show how much air you breathe in and out.  Allergy  tests.  Imaging tests such as X-rays. TREATMENT  Asthma cannot be cured, but it can usually be controlled. Treatment involves identifying and avoiding your asthma triggers. It also involves medicines. There are 2 classes of medicine used for asthma treatment:   Controller medicines. These prevent asthma symptoms from occurring. They are usually taken every day.  Reliever or rescue medicines. These quickly relieve asthma symptoms. They are used as needed and provide short-term relief. Your health care provider will help you create an asthma action plan. An asthma action plan is a written plan for managing and treating your asthma attacks. It includes a list of your asthma triggers and how they may be avoided. It also includes information on when medicines should be taken and when their dosage should be changed. An action plan may also involve the use of a device called a peak flow meter. A peak flow meter measures how well the lungs are working. It helps you monitor your condition. HOME CARE INSTRUCTIONS   Take medicines only as directed by your health care provider. Speak with your health care provider if you have questions about how or when to take the medicines.  Use a peak flow meter as directed by your health care provider. Record and keep track of readings.  Understand and use the action plan to help minimize or stop an asthma attack without needing to seek medical care.  Control your home environment in the following ways to help prevent asthma attacks:  Do not smoke. Avoid being exposed to secondhand smoke.  Change your heating and air conditioning filter regularly.  Limit your use of fireplaces and wood stoves.  Get rid of pests (such as roaches   and mice) and their droppings.  Throw away plants if you see mold on them.  Clean your floors and dust regularly. Use unscented cleaning products.  Try to have someone else vacuum for you regularly. Stay out of rooms while they are  being vacuumed and for a short while afterward. If you vacuum, use a dust mask from a hardware store, a double-layered or microfilter vacuum cleaner bag, or a vacuum cleaner with a HEPA filter.  Replace carpet with wood, tile, or vinyl flooring. Carpet can trap dander and dust.  Use allergy-proof pillows, mattress covers, and box spring covers.  Wash bed sheets and blankets every week in hot water and dry them in a dryer.  Use blankets that are made of polyester or cotton.  Clean bathrooms and kitchens with bleach. If possible, have someone repaint the walls in these rooms with mold-resistant paint. Keep out of the rooms that are being cleaned and painted.  Wash hands frequently. SEEK MEDICAL CARE IF:   You have wheezing, shortness of breath, or a cough even if taking medicine to prevent attacks.  The colored mucus you cough up (sputum) is thicker than usual.  Your sputum changes from clear or white to yellow, green, gray, or bloody.  You have any problems that may be related to the medicines you are taking (such as a rash, itching, swelling, or trouble breathing).  You are using a reliever medicine more than 2-3 times per week.  Your peak flow is still at 50-79% of your personal best after following your action plan for 1 hour.  You have a fever. SEEK IMMEDIATE MEDICAL CARE IF:   You seem to be getting worse and are unresponsive to treatment during an asthma attack.  You are short of breath even at rest.  You get short of breath when doing very little physical activity.  You have difficulty eating, drinking, or talking due to asthma symptoms.  You develop chest pain.  You develop a fast heartbeat.  You have a bluish color to your lips or fingernails.  You are light-headed, dizzy, or faint.  Your peak flow is less than 50% of your personal best.   This information is not intended to replace advice given to you by your health care provider. Make sure you discuss any  questions you have with your health care provider.   Document Released: 07/11/2005 Document Revised: 04/01/2015 Document Reviewed: 02/07/2013 Elsevier Interactive Patient Education 2016 [employer]  

## 2016-01-07 ENCOUNTER — Other Ambulatory Visit: Payer: Self-pay | Admitting: Family Medicine

## 2016-01-07 DIAGNOSIS — Z1231 Encounter for screening mammogram for malignant neoplasm of breast: Secondary | ICD-10-CM

## 2016-01-21 ENCOUNTER — Ambulatory Visit: Payer: BLUE CROSS/BLUE SHIELD | Attending: Family Medicine

## 2019-01-05 ENCOUNTER — Ambulatory Visit
Admission: EM | Admit: 2019-01-05 | Discharge: 2019-01-05 | Disposition: A | Discharge status: Home / Self Care | Payer: Self-pay | Attending: Family Medicine | Admitting: Family Medicine

## 2019-01-05 ENCOUNTER — Ambulatory Visit (INDEPENDENT_AMBULATORY_CARE_PROVIDER_SITE_OTHER): Payer: Self-pay

## 2019-01-05 DIAGNOSIS — R110 Nausea: Secondary | ICD-10-CM

## 2019-01-05 DIAGNOSIS — W228XXA Striking against or struck by other objects, initial encounter: Secondary | ICD-10-CM

## 2019-01-05 DIAGNOSIS — S9032XA Contusion of left foot, initial encounter: Secondary | ICD-10-CM

## 2019-01-05 DIAGNOSIS — M79671 Pain in right foot: Secondary | ICD-10-CM

## 2019-01-05 MED ORDER — ONDANSETRON 8 MG PO TBDP: 8.0000 mg | ORAL_TABLET | Freq: Three times a day (TID) | ORAL | 0 refills | Status: AC | PRN

## 2019-01-05 MED ORDER — HYDROCODONE-ACETAMINOPHEN 5-325 MG PO TABS: ORAL_TABLET | ORAL | 0 refills | Status: DC

## 2019-01-05 NOTE — ED Triage Notes (Signed)
Pt hit her toe yesterday on a box and injured her right fourth toe. Has been placing ice on it and now having pain radiating in her whole right foot. Is currently limping and in a wheelchair. No otc meds tried today.

## 2019-01-05 NOTE — Discharge Instructions (Signed)
Rest, ice, elevation °

## 2019-01-05 NOTE — ED Provider Notes (Signed)
[facility]    CSN: [id number] Arrival date & time: 01/05/19  1318     History   Chief Complaint Chief Complaint  Patient presents with  . Toe Pain    HPI Vanessa Caldwell is a 53 y.o. female.   53 yo female with a c/o left foot pain since yesterday after she hit her foot on a box at a store. States she was walking in the store and there was a box she didn't see until after she had hit her foot against it. States her whole foot hurts but worse over the 4th toe. Has been putting ice. Also c/o nausea secondary to the foot discomfort.   Toe Pain    Past Medical History:  Diagnosis Date  . Asthma   . Cancer ([place])   . History of drug use    Remote hx crack (>25yrs ago)    Patient Active Problem List   Diagnosis Date Noted  . Postoperative state 12/19/2014  . Pelvic mass in female 12/18/2014  . Pain 12/18/2014  . Obesity, morbid, BMI 40.0-49.9 ([place]) 12/18/2014  . Pelvic mass 12/18/2014  . Mass of pelvis 12/06/2014    Past Surgical History:  Procedure Laterality Date  . ABDOMINAL HYSTERECTOMY    . cesarean hysterectomy    . CESAREAN SECTION    . CYSTOSCOPY Bilateral 12/19/2014   Procedure: CYSTOSCOPY with possible stent placement;  Surgeon: Benjaman Kindler, MD;  Location: [facility];  Service: Gynecology;  Laterality: Bilateral;  . CYSTOSCOPY W/ URETERAL STENT REMOVAL Bilateral 12/19/2014   Procedure: CYSTOSCOPY WITH STENT REMOVAL;  Surgeon: Benjaman Kindler, MD;  Location: [facility];  Service: Gynecology;  Laterality: Bilateral;  . EXAMINATION UNDER ANESTHESIA N/A 12/19/2014   Procedure: EXAM UNDER ANESTHESIA;  Surgeon: Benjaman Kindler, MD;  Location: [facility];  Service: Gynecology;  Laterality: N/A;  . LAPAROTOMY WITH STAGING N/A 12/19/2014   Procedure: laparotomy with staging  and lysis of adhesions;  Surgeon: Benjaman Kindler, MD;  Location: [facility];  Service: Gynecology;  Laterality: N/A;  . OMENTECTOMY  12/19/2014   Procedure: OMENTECTOMY;  Surgeon: Benjaman Kindler, MD;  Location: [facility];  Service: Gynecology;;  . Morene Crocker Bilateral 12/19/2014   Procedure: OOPHORECTOMY Bilateral;  Surgeon: Benjaman Kindler, MD;  Location: [facility];  Service: Gynecology;  Laterality: Bilateral;  . open bilateral hysterectomy      OB History    Gravida  6   Para  3   Term      Preterm      AB      Living  3     SAB      TAB      Ectopic      Multiple      Live Births               Home Medications    Prior to Admission medications   Medication Sig Start Date End Date Taking? Authorizing Provider  albuterol (PROVENTIL HFA;VENTOLIN HFA) 108 (90 Base) MCG/ACT inhaler Inhale 2 puffs into the lungs every 4 (four) hours as needed for wheezing or shortness of breath (cough). 2 puffs every 4-6 hours   Yes [provider]  albuterol (ACCUNEB) 1.25 MG/3ML nebulizer solution Take 1 ampule by nebulization every 6 (six) hours as needed for wheezing.    [provider]  beclomethasone (QVAR) 40 MCG/ACT inhaler Inhale 1 puff into the lungs 2 (two) times daily.    [provider]  docusate sodium (COLACE) 100 MG capsule  Take 1 capsule (100 mg total) by mouth 2 (two) times daily. 12/25/14   Benjaman Kindler, MD  HYDROcodone-acetaminophen (NORCO/VICODIN) 5-325 MG tablet 1-2 tabs po bid prn 01/05/19   Norval Gable, MD  HYDROmorphone (DILAUDID) 2 MG tablet Take 1 tablet (2 mg total) by mouth every 3 (three) hours as needed for moderate pain or severe pain. 12/25/14   Benjaman Kindler, MD  ibuprofen (ADVIL,MOTRIN) 800 MG tablet Take 1 tablet (800 mg total) by mouth every 8 (eight) hours as needed (mild pain). 12/25/14   Benjaman Kindler, MD  ondansetron (ZOFRAN ODT) 8 MG disintegrating tablet Take 1 tablet (8 mg total) by mouth every 8 (eight) hours as needed. 01/05/19   Norval Gable, MD  ondansetron (ZOFRAN) 4 MG tablet Take 1 tablet (4 mg total) by mouth every 6 (six) hours as needed for nausea. 12/25/14   Benjaman Kindler, MD   predniSONE (DELTASONE) 50 MG tablet 1 tablet pO daily 11/13/12   Ezequiel Essex, MD    Family History Family History  Problem Relation Age of Onset  . Diabetes Mother   . Diabetes Father   . Cancer Father   . Heart disease Father     Social History Social History   Tobacco Use  . Smoking status: Current Some Day Smoker    Packs/day: 0.25    Types: Cigarettes  . Smokeless tobacco: Never Used  Substance Use Topics  . Alcohol use: No    Comment: smokes a joint whenever she feels nauseated  . Drug use: Yes    Frequency: 1.0 times per week    Types: Marijuana     Allergies   Peanuts [peanut oil], Nutmeg oil (myristica oil), Bee venom, Latex, Other, Penicillin g, Penicillins, and Aspirin   Review of Systems Review of Systems   Physical Exam Triage Vital Signs ED Triage Vitals  Enc Vitals Group     BP 01/05/19 1329 (!) 147/98     Pulse Rate 01/05/19 1329 82     Resp 01/05/19 1329 18     Temp 01/05/19 1329 97.9 F (36.6 C)     Temp Source 01/05/19 1329 Oral     SpO2 01/05/19 1329 96 %     Weight 01/05/19 1332 300 lb (136.1 kg)     Height --      Head Circumference --      Peak Flow --      Pain Score 01/05/19 1332 8     Pain Loc --      Pain Edu? --      Excl. in [place]? --    No data found.  Updated Vital Signs BP (!) 147/98 (BP Location: Left Arm)   Pulse 82   Temp 97.9 F (36.6 C) (Oral)   Resp 18   Wt 136.1 kg   SpO2 96%   BMI 46.99 kg/m   Visual Acuity Right Eye Distance:   Left Eye Distance:   Bilateral Distance:    Right Eye Near:   Left Eye Near:    Bilateral Near:     Physical Exam Vitals signs and nursing note reviewed.  Constitutional:      General: She is not in acute distress.    Appearance: Normal appearance. She is not toxic-appearing or diaphoretic.  Musculoskeletal:     Left foot: Normal range of motion and normal capillary refill. Tenderness (diffuse tenderness over dorsum of foot; no ecchymosis noted; no obvious gross  deformity noted), bony tenderness and swelling (mild; diffuse; dorsum of foot)  present. No crepitus, deformity or laceration.     Comments: Foot neurovascularly intact  Neurological:     Mental Status: She is alert.      UC Treatments / Results  Labs (all labs ordered are listed, but only abnormal results are displayed) Labs Reviewed - No data to display  EKG None  Radiology Dg Foot Complete Right  Result Date: 01/05/2019 CLINICAL DATA:  53 year old female with a history of right toe pain EXAM: RIGHT FOOT COMPLETE - 3+ VIEW COMPARISON:  None. FINDINGS: No acute displaced fracture. No subluxation/dislocation. Degenerative changes of the interphalangeal joints and the midfoot. Degenerative changes of hindfoot. No joint effusion. No radiopaque foreign body. No focal soft tissue swelling. IMPRESSION: Negative for acute bony abnormality Electronically Signed   By: Corrie Mckusick D.O.   On: 01/05/2019 14:23    Procedures Procedures (including critical care time)  Medications Ordered in UC Medications - No data to display  Initial Impression / Assessment and Plan / UC Course  I have reviewed the triage vital signs and the nursing notes.  Pertinent labs & imaging results that were available during my care of the patient were reviewed by me and considered in my medical decision making (see chart for details).      Final Clinical Impressions(s) / UC Diagnoses   Final diagnoses:  Contusion of left foot, initial encounter  Nausea without vomiting     Discharge Instructions     Rest, ice, elevation    ED Prescriptions    Medication Sig Dispense Auth. Provider   ondansetron (ZOFRAN ODT) 8 MG disintegrating tablet Take 1 tablet (8 mg total) by mouth every 8 (eight) hours as needed. 6 tablet Norval Gable, MD   HYDROcodone-acetaminophen (NORCO/VICODIN) 5-325 MG tablet 1-2 tabs po bid prn 8 tablet Lucile Didonato, Linward Foster, MD      1. x-ray results and diagnosis reviewed with patient 2.  rx as per orders above; reviewed possible side effects, interactions, risks and benefits  3. Recommend supportive treatment as above  4. Follow-up prn if symptoms worsen or don't improve   Controlled Substance Prescriptions [place] Controlled Substance Registry consulted? Not Applicable   Norval Gable, MD 01/05/19 [phone]

## 2019-01-08 ENCOUNTER — Other Ambulatory Visit: Payer: Self-pay

## 2019-01-08 ENCOUNTER — Emergency Department
Admission: EM | Admit: 2019-01-08 | Discharge: 2019-01-08 | Disposition: A | Discharge status: Home / Self Care | Payer: Self-pay | Attending: Emergency Medicine | Admitting: Emergency Medicine

## 2019-01-08 ENCOUNTER — Encounter: Payer: Self-pay | Admitting: Emergency Medicine

## 2019-01-08 ENCOUNTER — Emergency Department: Payer: Self-pay

## 2019-01-08 DIAGNOSIS — Y999 Unspecified external cause status: Secondary | ICD-10-CM | POA: Insufficient documentation

## 2019-01-08 DIAGNOSIS — J45909 Unspecified asthma, uncomplicated: Secondary | ICD-10-CM | POA: Insufficient documentation

## 2019-01-08 DIAGNOSIS — W01198A Fall on same level from slipping, tripping and stumbling with subsequent striking against other object, initial encounter: Secondary | ICD-10-CM | POA: Insufficient documentation

## 2019-01-08 DIAGNOSIS — Z91040 Latex allergy status: Secondary | ICD-10-CM | POA: Insufficient documentation

## 2019-01-08 DIAGNOSIS — S93401A Sprain of unspecified ligament of right ankle, initial encounter: Secondary | ICD-10-CM | POA: Insufficient documentation

## 2019-01-08 DIAGNOSIS — F17210 Nicotine dependence, cigarettes, uncomplicated: Secondary | ICD-10-CM | POA: Insufficient documentation

## 2019-01-08 DIAGNOSIS — Y92002 Bathroom of unspecified non-institutional (private) residence single-family (private) house as the place of occurrence of the external cause: Secondary | ICD-10-CM | POA: Insufficient documentation

## 2019-01-08 DIAGNOSIS — Y939 Activity, unspecified: Secondary | ICD-10-CM | POA: Insufficient documentation

## 2019-01-08 DIAGNOSIS — Z91010 Allergy to peanuts: Secondary | ICD-10-CM | POA: Insufficient documentation

## 2019-01-08 DIAGNOSIS — S0990XA Unspecified injury of head, initial encounter: Secondary | ICD-10-CM | POA: Insufficient documentation

## 2019-01-08 LAB — CBC
HCT: 40.3 % (ref 36.0–46.0)
Hemoglobin: 13.1 g/dL (ref 12.0–15.0)
MCH: 31.5 pg (ref 26.0–34.0)
MCHC: 32.5 g/dL (ref 30.0–36.0)
MCV: 96.9 fL (ref 80.0–100.0)
Platelets: 231 10*3/uL (ref 150–400)
RBC: 4.16 MIL/uL (ref 3.87–5.11)
RDW: 12.3 % (ref 11.5–15.5)
WBC: 10.2 10*3/uL (ref 4.0–10.5)
nRBC: 0 % (ref 0.0–0.2)

## 2019-01-08 LAB — BASIC METABOLIC PANEL
Anion gap: 6 (ref 5–15)
BUN: 17 mg/dL (ref 6–20)
CO2: 28 mmol/L (ref 22–32)
Calcium: 9 mg/dL (ref 8.9–10.3)
Chloride: 106 mmol/L (ref 98–111)
Creatinine, Ser: 0.94 mg/dL (ref 0.44–1.00)
GFR calc Af Amer: >60 mL/min (ref >60)
GFR calc non Af Amer: >60 mL/min (ref >60)
Glucose, Bld: 156 mg/dL — ABNORMAL HIGH (ref 70–99)
Potassium: 4 mmol/L (ref 3.5–5.1)
Sodium: 140 mmol/L (ref 135–145)

## 2019-01-08 MED ORDER — HYDROCODONE-ACETAMINOPHEN 5-325 MG PO TABS
1.0000 | ORAL_TABLET | Freq: Once | ORAL | Status: AC
  Administered 2019-01-08: 21:00:00 1 via ORAL
  Filled 2019-01-08: qty 1

## 2019-01-08 MED ORDER — HYDROCODONE-ACETAMINOPHEN 5-325 MG PO TABS: 1.0000 | ORAL_TABLET | Freq: Four times a day (QID) | ORAL | 0 refills | Status: DC | PRN

## 2019-01-08 MED ORDER — ONDANSETRON 4 MG PO TBDP
4.0000 mg | ORAL_TABLET | Freq: Once | ORAL | Status: AC
  Administered 2019-01-08: 4 mg via ORAL
  Filled 2019-01-08: qty 1

## 2019-01-08 MED ORDER — MORPHINE SULFATE (PF) 4 MG/ML IV SOLN
6.0000 mg | Freq: Once | INTRAVENOUS | Status: AC
  Administered 2019-01-08: 6 mg via INTRAMUSCULAR
  Filled 2019-01-08: qty 2

## 2019-01-08 NOTE — ED Triage Notes (Signed)
Pt states she fell last Friday, with c/o pain to the fourth digit on the right foot, urgent care Sat diagnosed with contusion, fell again Sat because she is unable to put pressure on her right foot, has not passed out, has had an ongoing headache since Friday and before the falls,  vomiting, "loud banging" sound in head. Has never been diagnosed with migraine. NAD.

## 2019-01-08 NOTE — ED Triage Notes (Signed)
Also c/o right knee pain from fall.

## 2019-01-08 NOTE — ED Notes (Signed)
Patient AaOx4 Vitals Stable NAD.

## 2019-01-08 NOTE — Discharge Instructions (Signed)
No driving or operating dangerous machinery while taking hydrocodone.

## 2019-01-08 NOTE — ED Notes (Signed)
ED Provider at bedside. 

## 2019-01-08 NOTE — ED Provider Notes (Signed)
[facility] Emergency Department Provider Note   ____________________________________________   First MD Initiated Contact with Patient 01/08/19 1901     (approximate)  I have reviewed the triage vital signs and the nursing notes.   HISTORY  Chief Complaint Foot Pain, Headache, and Emesis    HPI Vanessa Caldwell is a 53 y.o. female here for evaluation for a few things  Patient reports that on Friday she  she was having bit of a headache, she was going into the bathroom and she fell.  She fell forward striking her back of the head pretty hard on the toilet, and fell with her right foot into a awkward twisted position underneath her.  Since then she is been having severe pain across the top of the foot and the front of the ankle.  She is had off and on headache, some nausea.  She reports she felt like a strange banging sound in her head when she hit the floor and she fell very hard upon it.  She did not pass out.  Some nausea.  Moderate ongoing headache.  Seen in urgent care and was given prescription for hydrocodone which has helped some, but continues to have pain and discomfort over the top of the right foot which she rates as an 8-1/2 out of 10 pain  Her husband and family have been helping her get around, but for the most part she is only able to put a little bit of weight on the foot  No fevers or chills.  No exposure to coronavirus.  Slight nausea no abdominal pain.  No chest pain or trouble breathing.  No racing heart.  Past Medical History:  Diagnosis Date  . Asthma   . Cancer ([place])   . History of drug use    Remote hx crack (>9yrs ago)    Patient Active Problem List   Diagnosis Date Noted  . Postoperative state 12/19/2014  . Pelvic mass in female 12/18/2014  . Pain 12/18/2014  . Obesity, morbid, BMI 40.0-49.9 ([place]) 12/18/2014  . Pelvic mass 12/18/2014  . Mass of pelvis 12/06/2014    Past Surgical History:  Procedure Laterality Date  .  ABDOMINAL HYSTERECTOMY    . cesarean hysterectomy    . CESAREAN SECTION    . CYSTOSCOPY Bilateral 12/19/2014   Procedure: CYSTOSCOPY with possible stent placement;  Surgeon: Benjaman Kindler, MD;  Location: [facility];  Service: Gynecology;  Laterality: Bilateral;  . CYSTOSCOPY W/ URETERAL STENT REMOVAL Bilateral 12/19/2014   Procedure: CYSTOSCOPY WITH STENT REMOVAL;  Surgeon: Benjaman Kindler, MD;  Location: [facility];  Service: Gynecology;  Laterality: Bilateral;  . EXAMINATION UNDER ANESTHESIA N/A 12/19/2014   Procedure: EXAM UNDER ANESTHESIA;  Surgeon: Benjaman Kindler, MD;  Location: [facility];  Service: Gynecology;  Laterality: N/A;  . LAPAROTOMY WITH STAGING N/A 12/19/2014   Procedure: laparotomy with staging  and lysis of adhesions;  Surgeon: Benjaman Kindler, MD;  Location: [facility];  Service: Gynecology;  Laterality: N/A;  . OMENTECTOMY  12/19/2014   Procedure: OMENTECTOMY;  Surgeon: Benjaman Kindler, MD;  Location: [facility];  Service: Gynecology;;  . Morene Crocker Bilateral 12/19/2014   Procedure: OOPHORECTOMY Bilateral;  Surgeon: Benjaman Kindler, MD;  Location: [facility];  Service: Gynecology;  Laterality: Bilateral;  . open bilateral hysterectomy      Prior to Admission medications   Medication Sig Start Date End Date Taking? Authorizing Provider  albuterol (ACCUNEB) 1.25 MG/3ML nebulizer solution Take 1 ampule by nebulization every 6 (six) hours as  needed for wheezing.    [provider]  albuterol (PROVENTIL HFA;VENTOLIN HFA) 108 (90 Base) MCG/ACT inhaler Inhale 2 puffs into the lungs every 4 (four) hours as needed for wheezing or shortness of breath (cough). 2 puffs every 4-6 hours    [provider]  beclomethasone (QVAR) 40 MCG/ACT inhaler Inhale 1 puff into the lungs 2 (two) times daily.    [provider]  docusate sodium (COLACE) 100 MG capsule Take 1 capsule (100 mg total) by mouth 2 (two) times daily. 12/25/14   Benjaman Kindler, MD  HYDROcodone-acetaminophen  (NORCO/VICODIN) 5-325 MG tablet Take 1 tablet by mouth every 6 (six) hours as needed for moderate pain. 01/08/19   Delman Kitten, MD  HYDROmorphone (DILAUDID) 2 MG tablet Take 1 tablet (2 mg total) by mouth every 3 (three) hours as needed for moderate pain or severe pain. 12/25/14   Benjaman Kindler, MD  ibuprofen (ADVIL,MOTRIN) 800 MG tablet Take 1 tablet (800 mg total) by mouth every 8 (eight) hours as needed (mild pain). 12/25/14   Benjaman Kindler, MD  ondansetron (ZOFRAN ODT) 8 MG disintegrating tablet Take 1 tablet (8 mg total) by mouth every 8 (eight) hours as needed. 01/05/19   Norval Gable, MD  ondansetron (ZOFRAN) 4 MG tablet Take 1 tablet (4 mg total) by mouth every 6 (six) hours as needed for nausea. 12/25/14   Benjaman Kindler, MD  predniSONE (DELTASONE) 50 MG tablet 1 tablet pO daily 11/13/12   Rancour, Annie Main, MD    Allergies Peanuts [peanut oil], Nutmeg oil (myristica oil), Bee venom, Latex, Other, Penicillin g, Penicillins, and Aspirin  Family History  Problem Relation Age of Onset  . Diabetes Mother   . Diabetes Father   . Cancer Father   . Heart disease Father     Social History Social History   Tobacco Use  . Smoking status: Current Some Day Smoker    Packs/day: 0.25    Types: Cigarettes  . Smokeless tobacco: Never Used  Substance Use Topics  . Alcohol use: No    Comment: smokes a joint whenever she feels nauseated  . Drug use: Yes    Frequency: 1.0 times per week    Types: Marijuana    Review of Systems Constitutional: No fever/chills Eyes: No visual changes. ENT: No sore throat.  No neck pain. Cardiovascular: Denies chest pain. Respiratory: Denies shortness of breath. Gastrointestinal: No abdominal pain.  Some slight nausea. Genitourinary: Negative for dysuria. Musculoskeletal: Negative for back pain.  See HPI, only injury is the right foot right ankle.  No rash or bleeding.  No numbness tingling or weakness in the feet, but a lot of pain when she tries to  bear weight up on the right ankle region top of the foot Skin: Negative for rash. Neurological: Negative for areas of focal weakness or numbness.    ____________________________________________   PHYSICAL EXAM:  VITAL SIGNS: ED Triage Vitals  Enc Vitals Group     BP 01/08/19 1526 (!) 142/85     Pulse Rate 01/08/19 1526 93     Resp 01/08/19 1526 20     Temp 01/08/19 1526 97.9 F (36.6 C)     Temp Source 01/08/19 1526 Oral     SpO2 01/08/19 1900 100 %     Weight 01/08/19 1531 (!) 400 lb (181.4 kg)     Height 01/08/19 1531 5\' 7"  (1.702 m)     Head Circumference --      Peak Flow --  Pain Score 01/08/19 1530 8     Pain Loc --      Pain Edu? --      Excl. in [place]? --     Constitutional: Alert and oriented. Well appearing and in no acute distress. Eyes: Conjunctivae are normal. Head: Atraumatic. Nose: No congestion/rhinnorhea. Mouth/Throat: Mucous membranes are moist. Neck: No stridor.  Cardiovascular: Normal rate, regular rhythm. Grossly normal heart sounds.  Good peripheral circulation. Respiratory: Normal respiratory effort.  No retractions. Lungs CTAB. Gastrointestinal: Soft and nontender. No distention. Musculoskeletal:   Lower Extremities  No edema. Normal DP/PT pulses bilateral with good cap refill.  Normal neuro-motor function lower extremities bilateral.  RIGHT Right lower extremity demonstrates normal strength, good use of all muscles except limitation due to pain with plantar and dorsiflexion and use of the right ankle, she reports significant pain across the dorsal surface of the right foot and ankle and has some edema overlying the anterior and dorsal foot. No edema bruising or contusions of the right hip, right knee, or bottom of foot. Full range of motion of the right lower extremity with at least modest pain to range of motion of right ankle.  No cellulitic changes no abscess.  No joint effusions noted.  LEFT Left lower extremity demonstrates normal  strength, good use of all muscles. No edema bruising or contusions of the hip,  knee, ankle. Full range of motion of the left lower extremity without pain. No pain on axial loading. No evidence of trauma.   Neurologic:  Normal speech and language. No gross focal neurologic deficits are appreciated.  Skin:  Skin is warm, dry and intact. No rash noted. Psychiatric: Mood and affect are normal. Speech and behavior are normal.  ____________________________________________   LABS (all labs ordered are listed, but only abnormal results are displayed)  Labs Reviewed  BASIC METABOLIC PANEL - Abnormal; Notable for the following components:      Result Value   Glucose, Bld 156 (*)    All other components within normal limits  CBC   ____________________________________________  EKG  No chest symptoms ____________________________________________  RADIOLOGY  Dg Tibia/fibula Right  Result Date: 01/08/2019 CLINICAL DATA:  Fall, pain EXAM: RIGHT TIBIA AND FIBULA - 2 VIEW COMPARISON:  None. FINDINGS: Degenerative changes in the right knee with joint space narrowing and spurring in all 3 compartments. No acute bony abnormality. Specifically, no fracture, subluxation, or dislocation. IMPRESSION: No acute bony abnormality. Electronically Signed   By: Rolm Baptise M.D.   On: 01/08/2019 19:53   Ct Head Wo Contrast  Result Date: 01/08/2019 CLINICAL DATA:  52 y/o F; multiple falls, head injury, headache, confusion. EXAM: CT HEAD WITHOUT CONTRAST TECHNIQUE: Contiguous axial images were obtained from the base of the skull through the vertex without intravenous contrast. COMPARISON:  None. FINDINGS: Brain: No evidence of acute infarction, hemorrhage, hydrocephalus, extra-axial collection or mass lesion/mass effect. Vascular: No hyperdense vessel or unexpected calcification. Skull: Normal. Negative for fracture or focal lesion. Sinuses/Orbits: No acute finding. Other: None. IMPRESSION: No acute intracranial  abnormality identified. Unremarkable CT of the head. Electronically Signed   By: Kristine Garbe M.D.   On: 01/08/2019 19:49   Dg Foot Complete Right  Result Date: 01/08/2019 CLINICAL DATA:  Fall, pain EXAM: RIGHT FOOT COMPLETE - 3+ VIEW COMPARISON:  01/05/2019 FINDINGS: There is no evidence of fracture or dislocation. There is no evidence of arthropathy or other focal bone abnormality. Soft tissues are unremarkable. IMPRESSION: Negative. Electronically Signed   By: Rolm Baptise  M.D.   On: 01/08/2019 19:52    Imaging reviewed, CT negative for acute.  Notably no traumatic injury.  X-rays foot right tib-fib did not demonstrate acute bony injury ____________________________________________   PROCEDURES  Procedure(s) performed: None  Procedures  Critical Care performed: No  ____________________________________________   INITIAL IMPRESSION / ASSESSMENT AND PLAN / ED COURSE  Pertinent labs & imaging results that were available during my care of the patient were reviewed by me and considered in my medical decision making (see chart for details).   Patient fall on Friday, seen in urgent care.  She reports she started having a headache that led her to head to the bathroom and when she fell striking her face leading to significant headache for the last few days as well as lots of pain over the anterior right foot and ankle.  Clinical history seems to support likely musculoskeletal strain likely of the ATFL of the right foot.  There is no evidence of bony injury, superinfection, or joint derangement.  She does report having mild headache, then falling and having significant headache.  Head CT negative for acute.  She is alert and oriented reassuring normal neurologic examination.  Denies recent infection or fever symptoms.  No symptoms suggestive of COVID. Vanessa Caldwell was evaluated in Emergency Department on 01/08/2019 for the symptoms described in the history of present illness. She was  evaluated in the context of the global COVID-19 pandemic, which necessitated consideration that the patient might be at risk for infection with the SARS-CoV-2 virus that causes COVID-19. Institutional protocols and algorithms that pertain to the evaluation of patients at risk for COVID-19 are in a state of rapid change based on information released by regulatory bodies including the CDC and federal and state organizations. These policies and algorithms were followed during the patient's care in the ED.  Reviewed urgent care note, no mention of headache during that note but she tells me that she had a headache occurring prior to the fall and injury.  I do not see any signs or symptoms that would suggest tumor, intracranial hemorrhage, subarachnoid hemorrhage, or significant signs or symptoms suggest an acute life-threatening headache.  Additionally, examination appears consistent with musculoskeletal injury about the right foot and ankle likely a sprain.  Patient pain improved with morphine, given hydra codon which she has run out of from urgent care but given the pain and discomfort she has been in, I will continue her with a additional short course of hydrocodone.  Prescriber database reviewed.  Patient comfortable with this plan, pain improved Ace wrap applied and she is comfortable with the plan to take walker which she will utilize for assistance as she continues to heal and will follow up with her doctor whom she is able to have tele-consultation with and actually spoke to on the phone today  Vascular intact lower extremities bilateral.  Nexus negative regarding neck without any midline cervical thoracic or lumbar pain  Return precautions and treatment recommendations and follow-up discussed with the patient who is agreeable with the plan.       ____________________________________________   FINAL CLINICAL IMPRESSION(S) / ED DIAGNOSES  Final diagnoses:  Severe ankle sprain, right, initial  encounter  Closed head injury, initial encounter        Note:  This document was prepared using Dragon voice recognition software and may include unintentional dictation errors       Delman Kitten, MD 01/08/19 2147

## 2019-09-10 ENCOUNTER — Emergency Department
Admission: EM | Admit: 2019-09-10 | Discharge: 2019-09-10 | Disposition: A | Discharge status: Home / Self Care | Payer: Self-pay | Attending: Emergency Medicine | Admitting: Emergency Medicine

## 2019-09-10 ENCOUNTER — Encounter: Payer: Self-pay | Admitting: Emergency Medicine

## 2019-09-10 ENCOUNTER — Other Ambulatory Visit: Payer: Self-pay

## 2019-09-10 DIAGNOSIS — R1084 Generalized abdominal pain: Secondary | ICD-10-CM

## 2019-09-10 DIAGNOSIS — Z886 Allergy status to analgesic agent status: Secondary | ICD-10-CM | POA: Insufficient documentation

## 2019-09-10 DIAGNOSIS — R112 Nausea with vomiting, unspecified: Secondary | ICD-10-CM

## 2019-09-10 DIAGNOSIS — Z880 Allergy status to penicillin: Secondary | ICD-10-CM | POA: Insufficient documentation

## 2019-09-10 DIAGNOSIS — J45909 Unspecified asthma, uncomplicated: Secondary | ICD-10-CM | POA: Insufficient documentation

## 2019-09-10 DIAGNOSIS — F17210 Nicotine dependence, cigarettes, uncomplicated: Secondary | ICD-10-CM | POA: Insufficient documentation

## 2019-09-10 DIAGNOSIS — K529 Noninfective gastroenteritis and colitis, unspecified: Secondary | ICD-10-CM

## 2019-09-10 DIAGNOSIS — Z91040 Latex allergy status: Secondary | ICD-10-CM | POA: Insufficient documentation

## 2019-09-10 DIAGNOSIS — Z91010 Allergy to peanuts: Secondary | ICD-10-CM | POA: Insufficient documentation

## 2019-09-10 DIAGNOSIS — Z79899 Other long term (current) drug therapy: Secondary | ICD-10-CM | POA: Insufficient documentation

## 2019-09-10 DIAGNOSIS — Z91030 Bee allergy status: Secondary | ICD-10-CM | POA: Insufficient documentation

## 2019-09-10 DIAGNOSIS — K5289 Other specified noninfective gastroenteritis and colitis: Secondary | ICD-10-CM | POA: Insufficient documentation

## 2019-09-10 LAB — COMPREHENSIVE METABOLIC PANEL
ALT: 16 U/L (ref 0–44)
AST: 20 U/L (ref 15–41)
Albumin: 4.2 g/dL (ref 3.5–5.0)
Alkaline Phosphatase: 75 U/L (ref 38–126)
Anion gap: 15 (ref 5–15)
BUN: 15 mg/dL (ref 6–20)
CO2: 21 mmol/L — ABNORMAL LOW (ref 22–32)
Calcium: 9.5 mg/dL (ref 8.9–10.3)
Chloride: 102 mmol/L (ref 98–111)
Creatinine, Ser: 0.9 mg/dL (ref 0.44–1.00)
GFR calc Af Amer: >60 mL/min (ref >60)
GFR calc non Af Amer: >60 mL/min (ref >60)
Glucose, Bld: 192 mg/dL — ABNORMAL HIGH (ref 70–99)
Potassium: 3.6 mmol/L (ref 3.5–5.1)
Sodium: 138 mmol/L (ref 135–145)
Total Bilirubin: 0.8 mg/dL (ref 0.3–1.2)
Total Protein: 8 g/dL (ref 6.5–8.1)

## 2019-09-10 LAB — CBC
HCT: 39.6 % (ref 36.0–46.0)
Hemoglobin: 13.5 g/dL (ref 12.0–15.0)
MCH: 31.9 pg (ref 26.0–34.0)
MCHC: 34.1 g/dL (ref 30.0–36.0)
MCV: 93.6 fL (ref 80.0–100.0)
Platelets: 251 10*3/uL (ref 150–400)
RBC: 4.23 MIL/uL (ref 3.87–5.11)
RDW: 11.7 % (ref 11.5–15.5)
WBC: 10 10*3/uL (ref 4.0–10.5)
nRBC: 0 % (ref 0.0–0.2)

## 2019-09-10 LAB — LIPASE, BLOOD: Lipase: 24 U/L (ref 11–51)

## 2019-09-10 MED ORDER — PROMETHAZINE HCL 25 MG/ML IJ SOLN
12.5000 mg | Freq: Once | INTRAMUSCULAR | Status: AC
  Administered 2019-09-10: 20:00:00 12.5 mg via INTRAVENOUS

## 2019-09-10 MED ORDER — SODIUM CHLORIDE 0.9 % IV BOLUS
1000.0000 mL | Freq: Once | INTRAVENOUS | Status: AC
  Administered 2019-09-10: 20:00:00 1000 mL via INTRAVENOUS

## 2019-09-10 MED ORDER — MORPHINE SULFATE (PF) 4 MG/ML IV SOLN
4.0000 mg | Freq: Once | INTRAVENOUS | Status: AC
  Administered 2019-09-10: 20:00:00 4 mg via INTRAVENOUS
  Filled 2019-09-10: qty 1

## 2019-09-10 MED ORDER — PROMETHAZINE HCL 12.5 MG PO TABS: 12.5000 mg | ORAL_TABLET | Freq: Four times a day (QID) | ORAL | 0 refills | Status: AC | PRN

## 2019-09-10 MED ORDER — SODIUM CHLORIDE 0.9% FLUSH: 3.0000 mL | Freq: Once | INTRAVENOUS | Status: DC

## 2019-09-10 MED ORDER — ONDANSETRON 4 MG PO TBDP
4.0000 mg | ORAL_TABLET | Freq: Once | ORAL | Status: AC | PRN
  Administered 2019-09-10: 19:00:00 4 mg via ORAL
  Filled 2019-09-10: qty 1

## 2019-09-10 NOTE — ED Triage Notes (Signed)
Pt presents to ED via EMS with c/o possible food poisoning with abd bloating and cramping and frequent vomiting and diarrhea. Pt actively vomiting in triage. Pt reports she ate banana pudding just prior to the onset of her symptoms.

## 2019-09-10 NOTE — Discharge Instructions (Addendum)
Please make sure you are drinking lots of fluids.  Eat a bland diet such as bread rice bananas Jell-O.  Take Phenergan as needed for nausea or vomiting.  Return to the ER for any increasing pain, vomiting, diarrhea, fevers, worsening symptoms or urgent changes in your health.

## 2019-09-10 NOTE — ED Triage Notes (Signed)
First Nurse Note:  C/O nausea/ vomiting x 5 hours.  Zofran given IM by EMS.  VS wnl.  Per EMS report, patient ate some banana pudding for lunch and became ill after eating the pudding.

## 2019-09-10 NOTE — ED Provider Notes (Signed)
[facility] Provider Note   CSN: [id number] Arrival date & time: 09/10/19  1843     History Chief Complaint  Patient presents with  . Abdominal Pain  . Vomiting  . Diarrhea    Vanessa Caldwell is a 54 y.o. female presents to the emergency department for evaluation of sudden onset vomiting, diarrhea and abdominal cramping.  Symptoms began a couple hours ago after eating banana pudding.  She denies knowing any other contacts with similar symptoms.  No fevers, chills or body aches.  She has had around 10 episodes of vomiting and around 10 episodes of diarrhea in the last couple hours.  Patient states she is currently dry heaving.  She was given Zofran in triage with very little relief.  She describes her abdominal discomfort as cramping.  No blood in vomit or diarrhea.  She denies any history of abdominal surgery.  Has had bilateral oophorectomies.  No chest pain or shortness of breath.  HPI     Past Medical History:  Diagnosis Date  . Asthma   . Cancer ([place])   . History of drug use    Remote hx crack (>36yrs ago)    Patient Active Problem List   Diagnosis Date Noted  . Postoperative state 12/19/2014  . Pelvic mass in female 12/18/2014  . Pain 12/18/2014  . Obesity, morbid, BMI 40.0-49.9 ([place]) 12/18/2014  . Pelvic mass 12/18/2014  . Mass of pelvis 12/06/2014    Past Surgical History:  Procedure Laterality Date  . ABDOMINAL HYSTERECTOMY    . cesarean hysterectomy    . CESAREAN SECTION    . CYSTOSCOPY Bilateral 12/19/2014   Procedure: CYSTOSCOPY with possible stent placement;  Surgeon: Benjaman Kindler, MD;  Location: [facility] ORS;  Service: Gynecology;  Laterality: Bilateral;  . CYSTOSCOPY W/ URETERAL STENT REMOVAL Bilateral 12/19/2014   Procedure: CYSTOSCOPY WITH STENT REMOVAL;  Surgeon: Benjaman Kindler, MD;  Location: [facility] OR[facility];  Service: Gynecology;  Laterality: Bilateral;  . EXAMINATION UNDER ANESTHESIA N/A 12/19/2014   Procedure: EXAM  UNDER ANESTHESIA;  Surgeon: Benjaman Kindler, MD;  Location: [facility] OR[facility];  Service: Gynecology;  Laterality: N/A;  . LAPAROTOMY WITH STAGING N/A 12/19/2014   Procedure: laparotomy with staging  and lysis of adhesions;  Surgeon: Benjaman Kindler, MD;  Location: [facility] OR[facility];  Service: Gynecology;  Laterality: N/A;  . OMENTECTOMY  12/19/2014   Procedure: OMENTECTOMY;  Surgeon: Benjaman Kindler, MD;  Location: [facility];  Service: Gynecology;;  . Vanessa Caldwell Bilateral 12/19/2014   Procedure: OOPHORECTOMY Bilateral;  Surgeon: Benjaman Kindler, MD;  Location: [facility];  Service: Gynecology;  Laterality: Bilateral;  . open bilateral hysterectomy       OB History    Gravida  6   Para  3   Term      Preterm      AB      Living  3     SAB      TAB      Ectopic      Multiple      Live Births              Family History  Problem Relation Age of Onset  . Diabetes Mother   . Diabetes Father   . Cancer Father   . Heart disease Father     Social History   Tobacco Use  . Smoking status: Current Some Day Smoker    Packs/day: 0.25    Types: Cigarettes  . Smokeless tobacco: Never Used  Substance  Use Topics  . Alcohol use: No    Comment: smokes a joint whenever she feels nauseated  . Drug use: Yes    Frequency: 1.0 times per week    Types: Marijuana    Home Medications Prior to Admission medications   Medication Sig Start Date End Date Taking? Authorizing Provider  albuterol (ACCUNEB) 1.25 MG/3ML nebulizer solution Take 1 ampule by nebulization every 6 (six) hours as needed for wheezing.    [provider]  albuterol (PROVENTIL HFA;VENTOLIN HFA) 108 (90 Base) MCG/ACT inhaler Inhale 2 puffs into the lungs every 4 (four) hours as needed for wheezing or shortness of breath (cough). 2 puffs every 4-6 hours    [provider]  beclomethasone (QVAR) 40 MCG/ACT inhaler Inhale 1 puff into the lungs 2 (two) times daily.    [provider]  docusate sodium (COLACE)  100 MG capsule Take 1 capsule (100 mg total) by mouth 2 (two) times daily. 12/25/14   Benjaman Kindler, MD  HYDROcodone-acetaminophen (NORCO/VICODIN) 5-325 MG tablet Take 1 tablet by mouth every 6 (six) hours as needed for moderate pain. 01/08/19   Delman Kitten, MD  HYDROmorphone (DILAUDID) 2 MG tablet Take 1 tablet (2 mg total) by mouth every 3 (three) hours as needed for moderate pain or severe pain. 12/25/14   Benjaman Kindler, MD  ibuprofen (ADVIL,MOTRIN) 800 MG tablet Take 1 tablet (800 mg total) by mouth every 8 (eight) hours as needed (mild pain). 12/25/14   Benjaman Kindler, MD  ondansetron (ZOFRAN ODT) 8 MG disintegrating tablet Take 1 tablet (8 mg total) by mouth every 8 (eight) hours as needed. 01/05/19   Norval Gable, MD  ondansetron (ZOFRAN) 4 MG tablet Take 1 tablet (4 mg total) by mouth every 6 (six) hours as needed for nausea. 12/25/14   Benjaman Kindler, MD  predniSONE (DELTASONE) 50 MG tablet 1 tablet pO daily 11/13/12   Rancour, Annie Main, MD  promethazine (PHENERGAN) 12.5 MG tablet Take 1 tablet (12.5 mg total) by mouth every 6 (six) hours as needed for nausea or vomiting. 09/10/19   Duanne Guess, PA-C    Allergies    Peanuts [peanut oil], Nutmeg oil (myristica oil), Bee venom, Latex, Other, Penicillin g, Penicillins, and Aspirin  Review of Systems   Review of Systems  Constitutional: Negative for chills and fever.  Respiratory: Negative for chest tightness and shortness of breath.   Cardiovascular: Negative for chest pain.  Gastrointestinal: Positive for abdominal pain, diarrhea, nausea and vomiting. Negative for blood in stool and constipation.  Genitourinary: Negative for dysuria.  Musculoskeletal: Negative for back pain.  Skin: Negative for rash.  Neurological: Negative for headaches.    Physical Exam Updated Vital Signs BP (!) 160/52 (BP Location: Right Arm)   Pulse 71   Temp (!) 97.5 F (36.4 C) (Axillary)   Resp 20   Ht 5\' 7"  (1.702 m)   Wt 136.1 kg   SpO2 98%    BMI 46.99 kg/m   Physical Exam Constitutional:      Appearance: She is well-developed. She is obese.  HENT:     Head: Normocephalic and atraumatic.     Mouth/Throat:     Mouth: Mucous membranes are moist.     Pharynx: No oropharyngeal exudate.  Eyes:     Extraocular Movements: Extraocular movements intact.     Conjunctiva/sclera: Conjunctivae normal.  Cardiovascular:     Rate and Rhythm: Normal rate.  Pulmonary:     Effort: Pulmonary effort is normal. No respiratory distress.  Abdominal:     General: Bowel sounds are normal. There is no distension.     Palpations: Abdomen is soft.     Tenderness: There is abdominal tenderness.     Comments: Mild diffuse abdominal tenderness, abdominal soft with no distention.  Normal bowel sounds.  No grimacing with abdominal palpation.  Musculoskeletal:        General: Normal range of motion.     Cervical back: Normal range of motion.  Skin:    General: Skin is warm.     Findings: No rash.  Neurological:     General: No focal deficit present.     Mental Status: She is alert and oriented to person, place, and time.  Psychiatric:        Behavior: Behavior normal.        Thought Content: Thought content normal.        Judgment: Judgment normal.     ED Results / Procedures / Treatments   Labs (all labs ordered are listed, but only abnormal results are displayed) Labs Reviewed  COMPREHENSIVE METABOLIC PANEL - Abnormal; Notable for the following components:      Result Value   CO2 21 (*)    Glucose, Bld 192 (*)    All other components within normal limits  LIPASE, BLOOD  CBC  POC URINE PREG, ED    EKG None  Radiology No results found.  Procedures Procedures (including critical care time)  Medications Ordered in ED Medications  sodium chloride flush (NS) 0.9 % injection 3 mL (3 mLs Intravenous Refused 09/10/19 1952)  ondansetron (ZOFRAN-ODT) disintegrating tablet 4 mg (4 mg Oral Given 09/10/19 1912)  promethazine (PHENERGAN)  injection 12.5 mg (12.5 mg Intravenous Given 09/10/19 1952)  sodium chloride 0.9 % bolus 1,000 mL (1,000 mLs Intravenous New Bag/Given 09/10/19 1951)  morphine 4 MG/ML injection 4 mg (4 mg Intravenous Given 09/10/19 2002)    ED Course  I have reviewed the triage vital signs and the nursing notes.  Pertinent labs & imaging results that were available during my care of the patient were reviewed by me and considered in my medical decision making (see chart for details).    MDM Rules/Calculators/A&P                      54 year old female with sudden onset nausea vomiting diarrhea after eating banana pudding.  She feels as if this is food poisoning.  No known contacts with gastroenteritis symptoms.  She has no fevers, vital signs are stable.  After IV fluids, morphine and ODT Zofran as well as IV Phenergan patient symptoms resolved.  She is no longer having abdominal pain, cramping nor any nausea vomiting or diarrhea.  She is been able to tolerate p.o. fluids here in the ED.  She is given a prescription for Phenergan to take home.  She is encouraged to eat a bland diet and return to the ER for any worsening symptoms, pain fevers or any urgent changes in health. Final Clinical Impression(s) / ED Diagnoses Final diagnoses:  Nausea vomiting and diarrhea  Generalized abdominal pain  Gastroenteritis    Rx / DC Orders ED Discharge Orders         Ordered    promethazine (PHENERGAN) 12.5 MG tablet  Every 6 hours PRN     09/10/19 2136           Renata Caprice 09/10/19 2138    Vanessa Plymouth, MD 09/11/19 [phone]
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
## 2022-08-24 DIAGNOSIS — Z139 Encounter for screening, unspecified: Secondary | ICD-10-CM

## 2022-08-24 LAB — GLUCOSE, POCT (MANUAL RESULT ENTRY): POC Glucose: 204 mg/dl — AB (ref 70–99)
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
## 2024-03-22 ENCOUNTER — Emergency Department: Payer: Self-pay

## 2024-03-22 ENCOUNTER — Emergency Department
Admission: EM | Admit: 2024-03-22 | Discharge: 2024-03-22 | Disposition: A | Discharge status: Home / Self Care | Payer: Self-pay | Attending: Emergency Medicine | Admitting: Emergency Medicine

## 2024-03-22 ENCOUNTER — Other Ambulatory Visit: Payer: Self-pay

## 2024-03-22 DIAGNOSIS — J45909 Unspecified asthma, uncomplicated: Secondary | ICD-10-CM | POA: Insufficient documentation

## 2024-03-22 DIAGNOSIS — Y92410 Unspecified street and highway as the place of occurrence of the external cause: Secondary | ICD-10-CM | POA: Diagnosis not present

## 2024-03-22 DIAGNOSIS — M25561 Pain in right knee: Secondary | ICD-10-CM | POA: Diagnosis not present

## 2024-03-22 DIAGNOSIS — M545 Low back pain, unspecified: Secondary | ICD-10-CM | POA: Insufficient documentation

## 2024-03-22 DIAGNOSIS — M25562 Pain in left knee: Secondary | ICD-10-CM | POA: Diagnosis not present

## 2024-03-22 DIAGNOSIS — R030 Elevated blood-pressure reading, without diagnosis of hypertension: Secondary | ICD-10-CM | POA: Diagnosis not present

## 2024-03-22 DIAGNOSIS — M4312 Spondylolisthesis, cervical region: Secondary | ICD-10-CM | POA: Diagnosis not present

## 2024-03-22 DIAGNOSIS — M542 Cervicalgia: Secondary | ICD-10-CM | POA: Diagnosis present

## 2024-03-22 DIAGNOSIS — R519 Headache, unspecified: Secondary | ICD-10-CM | POA: Diagnosis not present

## 2024-03-22 MED ORDER — DEXAMETHASONE SODIUM PHOSPHATE 10 MG/ML IJ SOLN
8.0000 mg | Freq: Once | INTRAMUSCULAR | Status: AC
  Administered 2024-03-22: 8 mg via INTRAMUSCULAR
  Filled 2024-03-22: qty 1

## 2024-03-22 MED ORDER — ONDANSETRON 4 MG PO TBDP
4.0000 mg | ORAL_TABLET | Freq: Once | ORAL | Status: AC
  Administered 2024-03-22: 4 mg via ORAL
  Filled 2024-03-22: qty 1

## 2024-03-22 MED ORDER — ACETAMINOPHEN 325 MG PO TABS
650.0000 mg | ORAL_TABLET | Freq: Once | ORAL | Status: AC
  Administered 2024-03-22: 650 mg via ORAL
  Filled 2024-03-22: qty 2
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
## 2024-04-29 ENCOUNTER — Other Ambulatory Visit: Payer: Self-pay

## 2024-04-29 DIAGNOSIS — Z1231 Encounter for screening mammogram for malignant neoplasm of breast: Secondary | ICD-10-CM

## 2024-04-30 ENCOUNTER — Ambulatory Visit: Payer: Self-pay | Attending: Obstetrics and Gynecology | Admitting: *Deleted

## 2024-04-30 ENCOUNTER — Other Ambulatory Visit: Payer: Self-pay

## 2024-04-30 ENCOUNTER — Ambulatory Visit
Admission: RE | Admit: 2024-04-30 | Discharge: 2024-04-30 | Disposition: A | Discharge status: Home / Self Care | Payer: Self-pay | Source: Ambulatory Visit | Attending: Obstetrics and Gynecology | Admitting: Obstetrics and Gynecology

## 2024-04-30 VITALS — BP 146/81 | Ht 67.0 in | Wt 285.0 lb

## 2024-04-30 DIAGNOSIS — Z1231 Encounter for screening mammogram for malignant neoplasm of breast: Secondary | ICD-10-CM | POA: Insufficient documentation

## 2024-04-30 DIAGNOSIS — Z1211 Encounter for screening for malignant neoplasm of colon: Secondary | ICD-10-CM

## 2024-04-30 DIAGNOSIS — Z1239 Encounter for other screening for malignant neoplasm of breast: Secondary | ICD-10-CM

## 2024-04-30 NOTE — Progress Notes (Signed)
 [name] is a 58 y.o. female who presents to [facility] clinic today with no complaints.    Pap Smear: Pap smear not completed today. Last Pap smear was 01/06/2016 at [facility] clinic and was normal per patient. Per patient has no history of an abnormal Pap smear. Patient has a history of a hysterectomy 02/18/1997 for benign reasons. Patient doesn't need any further Pap smears due to her history of a hysterectomy for benign reasons. Last Pap smear result is available in Epic.   Physical exam: Breasts Breasts symmetrical. No skin abnormalities bilateral breasts. No nipple retraction bilateral breasts. No nipple discharge bilateral breasts. No lymphadenopathy. No lumps palpated bilateral breasts. No complaints of pain or tenderness on exam.  Pelvic/Bimanual Pap is not indicated today per BCCCP guidelines.   Smoking History: Patient is a current smoker. Discussed smoking cessation with patient. Referred to the [facility] Quitline.   Patient Navigation: Patient education provided. Access to services provided for patient through [facility] program.  Colorectal Cancer Screening: Per patient had a colonoscopy completed in 2002. FIT Test given to patient to complete. No complaints today.    Breast and Cervical Cancer Risk Assessment: Patient has family history of her maternal aunt having breast cancer. Patient has no known genetic mutations or history of radiation treatment to the chest before age 19. Patient does not have history of cervical dysplasia, immunocompromised, or DES exposure in-utero.  Risk Scores as of Encounter on 04/30/2024     Vanessa Caldwell           5-year 1.1%   Lifetime 5.48%            Last calculated by Vanessa Caldwell, Vanessa Caldwell, CMA on 04/30/2024 at  1:14 PM       A: BCCCP exam without pap smear No complaints.  P: Referred patient to the [facility] for a screening mammogram. Appointment scheduled Tuesday, April 30, 2024 at 1340.  Driscilla Wanda SHAUNNA, RN 04/30/2024 1:10  PM

## 2024-04-30 NOTE — Patient Instructions (Signed)
 Explained breast self awarenss with Dwayne SHAUNNA Boos. Patient did not need a Pap smear today due to patient has a history of a hysterectomy for benign reasons. Let patient know that she doesn't need any further Pap smears due to her history of a hysterectomy for benign reasons. Referred patient to the [facility] for a screening mammogram. Appointment scheduled Tuesday, April 30, 2024 at 1340. Patient aware of appointment and will be there. Let patient know [facility] will follow up with her within the next couple weeks with results of her mammogram by letter or phone. Dwayne SHAUNNA Stenerson verbalized understanding.  Amad Mau, Wanda Ship, RN 1:10 PM
